# Patient Record
Sex: Female | Born: 1970 | Race: Black or African American | Hispanic: No | Marital: Single | Smoking: Former smoker
Health system: Southern US, Community
[De-identification: ages and names within clinical notes are randomized; demographics above are authoritative.]

## PROBLEM LIST (undated history)

## (undated) DIAGNOSIS — E079 Disorder of thyroid, unspecified: Secondary | ICD-10-CM

## (undated) DIAGNOSIS — D573 Sickle-cell trait: Secondary | ICD-10-CM

## (undated) DIAGNOSIS — N390 Urinary tract infection, site not specified: Secondary | ICD-10-CM

## (undated) DIAGNOSIS — F32A Depression, unspecified: Secondary | ICD-10-CM

## (undated) DIAGNOSIS — F419 Anxiety disorder, unspecified: Secondary | ICD-10-CM

## (undated) DIAGNOSIS — D649 Anemia, unspecified: Secondary | ICD-10-CM

## (undated) DIAGNOSIS — G709 Myoneural disorder, unspecified: Secondary | ICD-10-CM

## (undated) DIAGNOSIS — F329 Major depressive disorder, single episode, unspecified: Secondary | ICD-10-CM

## (undated) HISTORY — DX: Anemia, unspecified: D64.9

## (undated) HISTORY — PX: BREAST SURGERY: SHX581

## (undated) HISTORY — PX: BREAST BIOPSY: SHX20

---
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
## 1997-06-25 ENCOUNTER — Inpatient Hospital Stay (HOSPITAL_COMMUNITY): Admission: AD | Admit: 1997-06-25 | Discharge: 1997-06-25 | Payer: Self-pay | Admitting: *Deleted

## 1997-06-27 ENCOUNTER — Ambulatory Visit (HOSPITAL_COMMUNITY): Admission: RE | Admit: 1997-06-27 | Discharge: 1997-06-27 | Payer: Self-pay | Admitting: *Deleted

## 1997-07-15 ENCOUNTER — Emergency Department (HOSPITAL_COMMUNITY): Admission: EM | Admit: 1997-07-15 | Discharge: 1997-07-15 | Payer: Self-pay | Admitting: Emergency Medicine

## 1998-11-02 ENCOUNTER — Inpatient Hospital Stay (HOSPITAL_COMMUNITY): Admission: AD | Admit: 1998-11-02 | Discharge: 1998-11-05 | Payer: Self-pay | Admitting: Obstetrics and Gynecology

## 1998-12-05 ENCOUNTER — Other Ambulatory Visit: Admission: RE | Admit: 1998-12-05 | Discharge: 1998-12-05 | Payer: Self-pay | Admitting: Obstetrics and Gynecology

## 1999-01-25 ENCOUNTER — Encounter (INDEPENDENT_AMBULATORY_CARE_PROVIDER_SITE_OTHER): Payer: Self-pay

## 1999-01-25 ENCOUNTER — Other Ambulatory Visit: Admission: RE | Admit: 1999-01-25 | Discharge: 1999-01-25 | Payer: Self-pay | Admitting: Obstetrics and Gynecology

## 1999-03-10 ENCOUNTER — Emergency Department (HOSPITAL_COMMUNITY): Admission: EM | Admit: 1999-03-10 | Discharge: 1999-03-10 | Payer: Self-pay | Admitting: Emergency Medicine

## 1999-11-02 ENCOUNTER — Emergency Department (HOSPITAL_COMMUNITY): Admission: EM | Admit: 1999-11-02 | Discharge: 1999-11-02 | Payer: Self-pay | Admitting: Emergency Medicine

## 2001-04-21 ENCOUNTER — Inpatient Hospital Stay (HOSPITAL_COMMUNITY): Admission: AD | Admit: 2001-04-21 | Discharge: 2001-04-21 | Payer: Self-pay | Admitting: Obstetrics and Gynecology

## 2001-04-21 ENCOUNTER — Other Ambulatory Visit: Admission: RE | Admit: 2001-04-21 | Discharge: 2001-04-21 | Payer: Self-pay | Admitting: Obstetrics and Gynecology

## 2001-10-27 ENCOUNTER — Inpatient Hospital Stay (HOSPITAL_COMMUNITY): Admission: AD | Admit: 2001-10-27 | Discharge: 2001-10-30 | Payer: Self-pay | Admitting: Obstetrics and Gynecology

## 2003-09-30 ENCOUNTER — Other Ambulatory Visit: Admission: RE | Admit: 2003-09-30 | Discharge: 2003-09-30 | Payer: Self-pay | Admitting: Obstetrics and Gynecology

## 2004-02-02 ENCOUNTER — Emergency Department (HOSPITAL_COMMUNITY): Admission: EM | Admit: 2004-02-02 | Discharge: 2004-02-02 | Payer: Self-pay | Admitting: Emergency Medicine

## 2004-11-29 ENCOUNTER — Other Ambulatory Visit: Admission: RE | Admit: 2004-11-29 | Discharge: 2004-11-29 | Payer: Self-pay | Admitting: Obstetrics and Gynecology

## 2007-06-27 ENCOUNTER — Inpatient Hospital Stay (HOSPITAL_COMMUNITY): Admission: AD | Admit: 2007-06-27 | Discharge: 2007-06-27 | Payer: Self-pay | Admitting: Obstetrics and Gynecology

## 2008-02-23 ENCOUNTER — Inpatient Hospital Stay (HOSPITAL_COMMUNITY): Admission: RE | Admit: 2008-02-23 | Discharge: 2008-02-26 | Payer: Self-pay | Admitting: Obstetrics and Gynecology

## 2008-04-18 ENCOUNTER — Emergency Department (HOSPITAL_COMMUNITY): Admission: EM | Admit: 2008-04-18 | Discharge: 2008-04-18 | Payer: Self-pay | Admitting: Emergency Medicine

## 2010-05-04 ENCOUNTER — Inpatient Hospital Stay (INDEPENDENT_AMBULATORY_CARE_PROVIDER_SITE_OTHER)
Admission: RE | Admit: 2010-05-04 | Discharge: 2010-05-04 | Disposition: A | Discharge status: Home / Self Care | Payer: BC Managed Care – PPO | Source: Ambulatory Visit | Attending: Family Medicine | Admitting: Family Medicine

## 2010-05-04 DIAGNOSIS — J069 Acute upper respiratory infection, unspecified: Secondary | ICD-10-CM

## 2010-05-04 LAB — POCT URINALYSIS DIPSTICK
Bilirubin Urine: NEGATIVE
Glucose, UA: NEGATIVE mg/dL
Hgb urine dipstick: NEGATIVE
Ketones, ur: NEGATIVE mg/dL
Nitrite: NEGATIVE
Protein, ur: NEGATIVE mg/dL
Specific Gravity, Urine: 1.015 (ref 1.005–1.030)
Urobilinogen, UA: 0.2 mg/dL (ref 0.0–1.0)
pH: 6 (ref 5.0–8.0)

## 2010-05-04 LAB — POCT PREGNANCY, URINE: Preg Test, Ur: NEGATIVE

## 2010-06-12 LAB — COMPREHENSIVE METABOLIC PANEL
ALT: 13 U/L (ref 0–35)
AST: 12 U/L (ref 0–37)
Albumin: 3.4 g/dL — ABNORMAL LOW (ref 3.5–5.2)
Alkaline Phosphatase: 38 U/L — ABNORMAL LOW (ref 39–117)
BUN: 3 mg/dL — ABNORMAL LOW (ref 6–23)
CO2: 28 mEq/L (ref 19–32)
Calcium: 8.8 mg/dL (ref 8.4–10.5)
Chloride: 103 mEq/L (ref 96–112)
Creatinine, Ser: 0.59 mg/dL (ref 0.4–1.2)
GFR calc Af Amer: >60 mL/min (ref >60)
GFR calc non Af Amer: >60 mL/min (ref >60)
Glucose, Bld: 94 mg/dL (ref 70–99)
Potassium: 3.7 mEq/L (ref 3.5–5.1)
Sodium: 135 mEq/L (ref 135–145)
Total Bilirubin: 0.2 mg/dL — ABNORMAL LOW (ref 0.3–1.2)
Total Protein: 6.7 g/dL (ref 6.0–8.3)

## 2010-06-12 LAB — URINALYSIS, ROUTINE W REFLEX MICROSCOPIC
Bilirubin Urine: NEGATIVE
Glucose, UA: NEGATIVE mg/dL
Hgb urine dipstick: NEGATIVE
Ketones, ur: NEGATIVE mg/dL
Nitrite: NEGATIVE
Protein, ur: NEGATIVE mg/dL
Specific Gravity, Urine: 1.016 (ref 1.005–1.030)
Urobilinogen, UA: 0.2 mg/dL (ref 0.0–1.0)
pH: 6 (ref 5.0–8.0)

## 2010-06-12 LAB — PREGNANCY, URINE: Preg Test, Ur: NEGATIVE

## 2010-06-12 LAB — DIFFERENTIAL
Basophils Absolute: 0 10*3/uL (ref 0.0–0.1)
Basophils Relative: 0 % (ref 0–1)
Eosinophils Absolute: 0.1 10*3/uL (ref 0.0–0.7)
Eosinophils Relative: 2 % (ref 0–5)
Lymphocytes Relative: 31 % (ref 12–46)
Lymphs Abs: 2 10*3/uL (ref 0.7–4.0)
Monocytes Absolute: 0.3 10*3/uL (ref 0.1–1.0)
Monocytes Relative: 5 % (ref 3–12)
Neutro Abs: 4.1 10*3/uL (ref 1.7–7.7)
Neutrophils Relative %: 63 % (ref 43–77)

## 2010-06-12 LAB — URINE MICROSCOPIC-ADD ON

## 2010-06-12 LAB — CBC
HCT: 34.3 % — ABNORMAL LOW (ref 36.0–46.0)
Hemoglobin: 11.8 g/dL — ABNORMAL LOW (ref 12.0–15.0)
MCHC: 34.5 g/dL (ref 30.0–36.0)
MCV: 87.7 fL (ref 78.0–100.0)
Platelets: 308 10*3/uL (ref 150–400)
RBC: 3.91 MIL/uL (ref 3.87–5.11)
RDW: 13.3 % (ref 11.5–15.5)
WBC: 6.5 10*3/uL (ref 4.0–10.5)

## 2010-06-12 LAB — LIPASE, BLOOD: Lipase: 23 U/L (ref 11–59)

## 2010-06-12 LAB — POCT CARDIAC MARKERS
CKMB, poc: <1 ng/mL — ABNORMAL LOW (ref 1.0–8.0)
Myoglobin, poc: 32.9 ng/mL (ref 12–200)
Troponin i, poc: <0.05 ng/mL (ref 0.00–0.09)

## 2010-06-12 LAB — D-DIMER, QUANTITATIVE: D-Dimer, Quant: 0.34 ug/mL-FEU (ref 0.00–0.48)

## 2010-07-10 NOTE — Op Note (Signed)
Shelby [name], Shelby [name]              ACCOUNT NO.:  [id number]   MEDICAL RECORD NO.:  [id number]          PATIENT TYPE:  INP   LOCATION:  9148                          FACILITY:  [facility]   PHYSICIAN:  Juluis Mire, M.D.   DATE OF BIRTH:  03/03/1970   DATE OF PROCEDURE:  02/23/2008  DATE OF DISCHARGE:                               OPERATIVE REPORT   PREOPERATIVE DIAGNOSIS:  Intrauterine pregnancy at term with prior  cesarean section for repeat.   POSTOPERATIVE DIAGNOSIS:  Intrauterine pregnancy at term with prior  cesarean section for repeat.   PROCEDURE:  Low transverse cesarean section.   SURGEON:  Juluis Mire, MD   ANESTHESIA:  Spinal.   ESTIMATED BLOOD LOSS:  600 mL.   PACKS AND DRAINS:  None.   INTRAOPERATIVE BLOOD PLACEMENT AND COMPLICATIONS:  None.   INDICATION:  Dictated history and physical.   PROCEDURE IN DETAIL:  The patient was taken to the OR, placed in supine  position with left lateral tilt.  After satisfactory level of spinal  anesthesia was obtained, the abdomen was prepped out with Betadine and  draped sterile field.  The prior low transverse skin incision was  identified and excised.  Incision was extended through the subcutaneous  tissue.  Fascia was entered sharply, incision fashioned laterally.  Fascia was taken off the muscles superiorly and inferiorly.  Rectus  muscles were separated in the midline.  Perineum was entered sharply.  Incision of perineum extended both superiorly and inferiorly.  A low  transverse bladder flap was developed.  Low transverse uterine incision  begun with a knife and extended laterally using manual traction.  Amniotic fluid was clear and presented in a vertex presentation, was  delivered with fundal pressure with the aid of the vacuum extractor.  The infant was a viable female who weighed 8 pounds 3 ounces, Apgars  were 8/9, umbilical artery pH was 7.30.  Placenta was delivered  manually.  Uterus exteriorized for closure.   Uterus was closed with  running locking suture of 0 chromic using a two-layer closure technique.  We had good hemostasis.  Tubes and ovaries were unremarkable.  Uterus  was returned to the abdominal cavity.  We irrigated the pelvis, had good  hemostasis, and clear urine output.  Muscles and peritoneum closed with  a running suture of 3-0 Vicryl.  Fascia closed with a running suture of  0 PDS.  Skin was closed with staples and Steri-Strips.  Sponges, instrument, and  needle count reported as correct by circulating nurse x2.  Foley  catheter remained clear at the time of closure.  The patient tolerated  the procedure well and was returned to the recovery room in stable  condition.      Juluis Mire, M.D.  Electronically Signed     JSM/MEDQ  D:  02/23/2008  T:  02/23/2008  Job:  [id number]

## 2010-07-10 NOTE — Discharge Summary (Signed)
[name], [name]              ACCOUNT NO.:  [id number]   MEDICAL RECORD NO.:  [id number]          PATIENT TYPE:  INP   LOCATION:  9148                          FACILITY:  [facility]   PHYSICIAN:  Freddy Finner, M.D.   DATE OF BIRTH:  January 27, 1971   DATE OF ADMISSION:  02/23/2008  DATE OF DISCHARGE:  02/26/2008                               DISCHARGE SUMMARY   ADMITTING DIAGNOSES:  1. Intrauterine pregnancy at term.  2. Previous cesarean section, desires repeat.   DISCHARGE DIAGNOSES:  1. Status post low-transverse cesarean section.  2. Viable female infant.   PROCEDURE:  Repeat low-transverse cesarean section.   REASON FOR ADMISSION:  Please see written H&P.   HOSPITAL COURSE:  The patient is a 40 year old African American married  female, gravida 7, para 2 that presented to [facility]  for scheduled cesarean section.  The patient had had 2 previous cesarean  deliveries and desired repeat.  On the morning of admission, the patient  was taken to operating room where spinal anesthesia was administered  without difficulty.  A low-transverse incision was made with delivery of  a viable female infant weighing 8 pounds 3 ounces with Apgars of 8 at  one minute and 9 at five minutes.  Arterial cord pH was 7.30.  The  patient tolerated the procedure well and was taken to the recovery room  in stable condition.  On postoperative day #1, the patient was without  complaint.  Vital signs were stable.  She was afebrile.  Abdomen soft.  Fundus firm and nontender.  Abdominal dressing noted be clean, dry, and  intact.  On postoperative day #2, the patient was without complaint.  Vital signs remained stable.  She was afebrile.  Fundus firm and  nontender.  Incision was clean, dry, and intact.  The patient is  ambulating well and tolerating a regular diet without complaints of  nausea and vomiting.  On postoperative day #3, the patient was without  complaint.  Vital signs remained  stable.  She was afebrile.  Fundus firm  and nontender.  Incision was clean, dry, and intact.  Staples removed,  and the patient was later discharged home.   CONDITION ON DISCHARGE:  Stable.   DIET:  Regular as tolerated.   ACTIVITY:  No heavy lifting, no driving x2 weeks, no vaginal entry.   FOLLOW UP:  Patient to follow up in the office in 1 week for an incision  check.  She is to call for temperature greater than 100 degrees,  persistent nausea, vomiting, heavy vaginal bleeding, and/or redness or  drainage from the incisional site.   DISCHARGE MEDICATIONS:  Tylox #30, 1 p.o. every 4-6 hours p.r.n., Motrin  600 mg every 6 hours, prenatal vitamins 1 p.o. daily, Colace 1 p.o.  daily p.r.n.      Julio Sicks, N.P.      Freddy Finner, M.D.  Electronically Signed    CC/MEDQ  D:  02/26/2008  T:  02/26/2008  Job:  [id number]

## 2010-07-10 NOTE — H&P (Signed)
NAMEKENDEL, PESNELL NO.:  [id number]   MEDICAL RECORD NO.:  [id number]          PATIENT TYPE:  INP   LOCATION:  NA                            [facility]ACILITY:  [facility]   PHYSICIAN:  Juluis Mire, M.D.   DATE OF BIRTH:  10/13/1970   DATE OF ADMISSION:  02/23/2008  DATE OF DISCHARGE:                              HISTORY & PHYSICAL   HISTORY OF PRESENT ILLNESS:  The patient is a 40 year old gravida 7,  para 2, abortus 4, female estimated date of confinement of January 2  given estimated gestational age of [redacted] weeks.  The patient has had 2  prior cesarean sections now presents for repeat cesarean section.  Prenatal course has been complicated by advanced maternal age.  She did  undergo first trimester screening that was negative and declined  amniocentesis.  She is also positive for sickle cell trait.  We never  got the father of the baby in for testing, but the patient states she  would not terminate the pregnancy based on any findings.  Therefore, we  did not proceed with amniocentesis.  Her urine was positive for group B  strep.  She will be given antibiotics preoperatively.   IN TERMS OF ALLERGIES:  She is sensitive to glove powder.   MEDICATIONS:  Include prenatal vitamins.   FOR PAST MEDICAL HISTORY, FAMILY HISTORY, AND SOCIAL HISTORY:  Please  see prenatal records.   REVIEW OF SYSTEMS:  Noncontributory.   PHYSICAL EXAMINATION:  VITAL SIGNS:  The patient is afebrile, stable  vital signs.  HEENT:  The patient is normocephalic.  Pupils equal, round, reactive to  light, and accommodation.  Extraocular movements are intact.  Sclerae  and conjunctivae are clear.  Oropharynx clear.  NECK:  Without thyromegaly.  BREASTS:  Not examined.  LUNGS:  Clear.  CARDIOVASCULAR SYSTEM:  Regular rhythm and rate, great 2/6 systolic  ejection murmur.  No clicks or gallops.  ABDOMEN:  Gravid uterus consistent with dates.  PELVIC:  Deferred.  EXTREMITIES:  Trace edema.  NEUROLOGY:   Grossly within normal limits.   IMPRESSION:  1. Intrauterine pregnancy at 39 plus weeks.  2. Two prior cesarean sections for repeat.  3. Sickle cell trait.  4. Positive group B strep in urine.  5. Advance maternal age with negative first trimester screening.   PLAN:  The patient will undergo repeat cesarean section.  The risks of  surgery have been discussed including the risk of infection.  The risk  of hemorrhage that could require transfusion with the risk of AIDS or  hepatitis.  Risk of injury to adjacent organs including bladder, bowel,  ureters that could require further exploratory surgery.  Risk of deep  venous thrombosis and pulmonary embolus.  The patient does understand  the potential risk and complications.       Juluis Mire, M.D.  Electronically Signed     JSM/MEDQ  D:  02/22/2008  T:  02/23/2008  Job:  [id number]

## 2010-07-13 NOTE — H&P (Signed)
NAME:  Shelby [name], Shelby [name]                        ACCOUNT NO.:  [id number]   MEDICAL RECORD NO.:  [id number]                   PATIENT TYPE:  INP   LOCATION:  9198                                 FACILITY:  [facility]   PHYSICIAN:  Juluis Mire, M.D.                DATE OF BIRTH:  March 21, 1970   DATE OF ADMISSION:  10/27/2001  DATE OF DISCHARGE:                                HISTORY & PHYSICAL   HISTORY OF PRESENT ILLNESS:  The patient is a 40 year old gravida 6, para 1-  0-4-1 married female who presents for a repeat low transverse cesarean  section.  Estimated date of confinement by ultrasound is September 11th  giving an estimated gestational age of approximately 39 weeks.   The patient had a prior low transverse cesarean section with the last  pregnancy due to failure to progress.  That infant did weigh 9 pounds 3  ounces.  A trial of labor was discussed with the patient.  She declines this  and wishes to proceed with repeat cesarean section for which she is admitted  at the present time.  Her prenatal course has been otherwise uncomplicated.  She does have a history of sickle trait.  Her husband is Caucasian.  They  decided not to proceed with a rescreen of him despite our discussion.   ALLERGIES:  No known drug allergies.   MEDICATIONS:  Include prenatal vitamins.   PAST MEDICAL HISTORY/FAMILY HISTORY/SOCIAL HISTORY:  Please see prenatal  records.   PRENATAL LABORATORY DATA:  The patient is A positive, negative antibody  screen.  Negative hepatitis B surface antigen.  Glucola 50 gm was within  normal limits.  The patient did decline maternal serum screening.   REVIEW OF SYSTEMS:  Noncontributory.   PHYSICAL EXAMINATION:  VITAL SIGNS:  The patient is afebrile, stable vital  signs.  HEENT:  The patient is normocephalic.  Pupils equal, round, reactive to  light and accommodation.  Extraocular movements are intact.  Sclerae and  conjunctivae are clear.  Oropharynx is clear.  NECK:  Without thyromegaly.  BREASTS:  Are glandular with no discrete masses.  LUNGS:  Clear.  CARDIOVASCULAR:  Regular rate, grade 2/6 systolic ejection murmur without  clicks or gallops.  ABDOMEN:  Gravid uterus consistent with date.  No organomegaly noted.  PELVIC:  Deferred.  EXTREMITIES:  Trace edema.  NEUROLOGICAL:  Grossly within normal limits.   IMPRESSION:  Intrauterine pregnancy at term with prior cesarean section  desires a repeat.   PLAN:  The patient to undergo repeat cesarean section at her request.  The  risks of surgery have been discussed including the risks of infection.  The  risk of hemorrhage that could necessitate transfusion with the risk of AIDS  or  hepatitis.  The risk of injury to adjacent organs including bladder, bowel,  or ureters that could require further exploratory surgery.  The risk of deep  venous thrombosis  and pulmonary embolus.  The patient appears to understand  indications and risks and acceptance of them.                                                Juluis Mire, M.D.    JSM/MEDQ  D:  10/27/2001  T:  10/27/2001  Job:  [id number]

## 2010-07-13 NOTE — Discharge Summary (Signed)
   NAME:  Shelby [name], Shelby [name]                        ACCOUNT NO.:  [id number]   MEDICAL RECORD NO.:  [id number]                   PATIENT TYPE:  INP   LOCATION:  9148                                 FACILITY:  [facility]   PHYSICIAN:  Michelle L. Vincente Poli, M.D.            DATE OF BIRTH:  1970-10-06   DATE OF ADMISSION:  10/27/2001  DATE OF DISCHARGE:  10/30/2001                                 DISCHARGE SUMMARY   ADMITTING DIAGNOSES:  1. Intrauterine pregnancy at term.  2. Previous cesarean delivery, desires repeat.   DISCHARGE DIAGNOSES:  1. Status post repeat low transverse cesarean delivery.  2. Viable female infant.   PROCEDURE:  Repeat low transverse cesarean section.   REASON FOR ADMISSION:  Please see dictated H&P.   HOSPITAL COURSE:  The patient was admitted for scheduled repeat cesarean  delivery.  The patient was taken to the operating room where spinal  anesthesia was administered without difficulty.  A low transverse incision  was made with the delivery of a viable female infant weighing 7 pounds 15  ounces, Apgars of 8 at one minute, 9 at five minutes.  Arterial cord pH was  noted to be 7.32.  The patient tolerated procedure well and was taken to the  recovery room in stable condition.  On postoperative day one patient had  good return of bowel function.  Abdomen was soft.  Abdominal dressing was  partially removed.  Incision was clean, dry, and intact.  Laboratories  revealed a hemoglobin of 9.8, WBC count of 7.8, platelet count of 217,000.  On postoperative day two abdomen was soft.  Incision was clean, dry, and  intact.  The patient was tolerating a regular diet without complaints of  nausea and vomiting.  On postoperative day three patient was ambulating well  without assistance.  Incision was clean, dry, and intact.  Staples were  removed and patient was discharged home.   CONDITION ON DISCHARGE:  Good.   DIET:  Regular, as tolerated.   ACTIVITY:  No heavy lifting.  No  driving x2 weeks.  No vaginal entry.   FOLLOW UP:  The patient is to follow up in the office in one to two weeks  for an incision check.  She should call for temperature greater than 100  degrees, persistent nausea and vomiting, heavy vaginal bleeding, and/or  redness or drainage from the incision site.   DISCHARGE MEDICATIONS:  1. Tylox one q.4-6h. p.r.n. pain.  2. Ibuprofen 600 mg q.6h. p.r.n.  3. Prenatal vitamins one p.o. daily.     Julio Sicks, NP                          Stann Mainland. Vincente Poli, M.D.    CC/MEDQ  D:  12/05/2001  T:  12/07/2001  Job:  [id number]

## 2010-07-13 NOTE — Op Note (Signed)
NAME:  Shelby [name], Shelby [name]                        ACCOUNT NO.:  [id number]   MEDICAL RECORD NO.:  [id number]                   PATIENT TYPE:  INP   LOCATION:  NA                                   [facility]   PHYSICIAN:  Juluis Mire, M.D.                DATE OF BIRTH:  07/08/1970   DATE OF PROCEDURE:  10/27/2001  DATE OF DISCHARGE:                                 OPERATIVE REPORT   PREOPERATIVE DIAGNOSES:  1. Intrauterine pregnancy at term.  2. Prior cesarean section, desires repeat.   POSTOPERATIVE DIAGNOSES:  1. Intrauterine pregnancy at term.  2. Prior cesarean section, desires repeat.   OPERATION PERFORMED:  Low transverse cesarean section.   SURGEON:  Juluis Mire, M.D.   ANESTHESIA:  Spinal.   ESTIMATED BLOOD LOSS:  800 cc.   PACKS AND DRAINS:  None.   INTRAOPERATIVE BLOOD REPLACEMENT:  None.   COMPLICATIONS:  None.   INDICATIONS FOR PROCEDURE:  Noted in history and physical.   PROCEDURE:  The patient was taken to the OR, placed in the supine position  left lateral tilt.  After satisfactory level of spinal anesthesia was  obtained, the abdomen was prepped out with Betadine and draped as a sterile  field.  Prior low transverse incision was excised.  The incision was then  extended through the subcutaneous tissue.  The fascia was entered sharply  and the incision extended laterally.  The fascia was taken off the rectus  muscle superiorly and inferiorly using sharp and blunt dissection.  The  rectus muscles were separated in the midline.  The peritoneum was entered  sharply and the incision of the peritoneum extended both superiorly and  inferiorly.  A low transverse bladder flap was developed.  Low transverse  uterine incision was begun with a knife and extended laterally using manual  traction.  The amniotic fluid was clear.  The infant presented in the vertex  presentation with delivery of the __________ head and fundal pressure.  The  infant was viable  female who weighed 7 pounds 15 ounces.  His Apgars were 8/9.  The umbilical cord pH was 7.32.  The placenta was then delivered manually.  Uterus wiped free of remaining membranes and placenta.  The uterus was then  closed with running-locked suture of 0 chromic using two layer closure  technique.  We had good hemostasis.  The uterus was exteriorized.  The tubes  and ovaries unremarkable.  Uterus was returned to the abdominal cavity.  The  pelvic cavity was thoroughly irrigated.  Hemostasis was excellent.  Urine  output clear and adequate.  The rectus muscles were reapproximated with  suture of  3-0 Vicryl.  The fascia was closed with running suture of 0 PDS.  The skin  was closed with staples and Steri-Strips.  Sponge, instrument, and needle  counts reported correct by circulating nurse x2.  Foley catheter remained  clear  until time of closure.  The patient tolerated the procedure well, was  returned to the recovery room in good condition.                                                 Juluis Mire, M.D.    JSM/MEDQ  D:  10/27/2001  T:  10/27/2001  Job:  [id number]

## 2010-11-21 LAB — POCT PREGNANCY, URINE
Operator id: 27537
Preg Test, Ur: POSITIVE

## 2010-11-21 LAB — ABO/RH: ABO/RH(D): A POS

## 2010-11-21 LAB — HCG, QUANTITATIVE, PREGNANCY: hCG, Beta Chain, Quant, S: 17769 — ABNORMAL HIGH

## 2010-11-29 LAB — CBC
HCT: 26.9 % — ABNORMAL LOW (ref 36.0–46.0)
HCT: 31.4 % — ABNORMAL LOW (ref 36.0–46.0)
Hemoglobin: 10.6 g/dL — ABNORMAL LOW (ref 12.0–15.0)
Hemoglobin: 9.2 g/dL — ABNORMAL LOW (ref 12.0–15.0)
MCHC: 33.8 g/dL (ref 30.0–36.0)
MCHC: 34.2 g/dL (ref 30.0–36.0)
MCV: 88.7 fL (ref 78.0–100.0)
MCV: 89.7 fL (ref 78.0–100.0)
Platelets: 194 10*3/uL (ref 150–400)
Platelets: 224 10*3/uL (ref 150–400)
RBC: 3.03 MIL/uL — ABNORMAL LOW (ref 3.87–5.11)
RBC: 3.49 MIL/uL — ABNORMAL LOW (ref 3.87–5.11)
RDW: 13.3 % (ref 11.5–15.5)
RDW: 14 % (ref 11.5–15.5)
WBC: 7 10*3/uL (ref 4.0–10.5)
WBC: 8.1 10*3/uL (ref 4.0–10.5)

## 2010-11-29 LAB — RPR: RPR Ser Ql: NONREACTIVE

## 2011-02-09 ENCOUNTER — Emergency Department (HOSPITAL_COMMUNITY)
Admission: EM | Admit: 2011-02-09 | Discharge: 2011-02-09 | Disposition: A | Discharge status: Home / Self Care | Payer: Self-pay | Source: Home / Self Care

## 2011-02-09 ENCOUNTER — Encounter: Payer: Self-pay | Admitting: Emergency Medicine

## 2011-02-09 DIAGNOSIS — N390 Urinary tract infection, site not specified: Secondary | ICD-10-CM

## 2011-02-09 HISTORY — DX: Urinary tract infection, site not specified: N39.0

## 2011-02-09 HISTORY — DX: Sickle-cell trait: D57.3

## 2011-02-09 LAB — POCT URINALYSIS DIP (DEVICE)
Bilirubin Urine: NEGATIVE
Glucose, UA: NEGATIVE mg/dL
Ketones, ur: NEGATIVE mg/dL
Nitrite: POSITIVE — AB
Protein, ur: NEGATIVE mg/dL
Specific Gravity, Urine: 1.015 (ref 1.005–1.030)
Urobilinogen, UA: 0.2 mg/dL (ref 0.0–1.0)
pH: 6.5 (ref 5.0–8.0)

## 2011-02-09 MED ORDER — CIPROFLOXACIN HCL 250 MG PO TABS: 250.0000 mg | ORAL_TABLET | Freq: Two times a day (BID) | ORAL | Status: AC

## 2011-02-09 NOTE — ED Provider Notes (Signed)
History     CSN: [id number] Arrival date & time: 02/09/2011  5:17 PM   None     Chief Complaint  Patient presents with  . Cystitis    (Consider location/radiation/quality/duration/timing/severity/associated sxs/prior treatment) HPI Comments: Onset one week ago of tingling at end of urine stream, pressure "in bladder" and pain Rt flank area. No fever or chills.   Patient is a 40 y.o. female presenting with dysuria. The history is provided by the patient.  Dysuria  This is a new problem. The current episode started more than 2 days ago. The problem occurs every urination. The problem has been gradually worsening. There has been no fever. Associated symptoms include frequency, urgency and flank pain. Pertinent negatives include no chills, no nausea, no vomiting and no hematuria. She has tried nothing for the symptoms. Her past medical history is significant for recurrent UTIs (last one > 1 yr ago).    Past Medical History  Diagnosis Date  . UTI (lower urinary tract infection)   . Sickle cell trait     Past Surgical History  Procedure Date  . Cesarean section     No family history on file.  History  Substance Use Topics  . Smoking status: Current Everyday Smoker  . Smokeless tobacco: Not on file  . Alcohol Use: Yes    OB History    Grav Para Term Preterm Abortions TAB SAB Ect Mult Living                  Review of Systems  Constitutional: Negative for fever and chills.  Respiratory: Negative for shortness of breath.   Cardiovascular: Negative for chest pain.  Gastrointestinal: Negative for nausea and vomiting.  Genitourinary: Positive for dysuria, urgency, frequency and flank pain. Negative for hematuria.    Allergies  Review of patient's allergies indicates no known allergies.  Home Medications   Current Outpatient Rx  Name Route Sig Dispense Refill  . CIPROFLOXACIN HCL 250 MG PO TABS Oral Take 1 tablet (250 mg total) by mouth every 12 (twelve) hours. 10  tablet 0    BP 140/85  Pulse 76  Temp(Src) 99 F (37.2 C) (Oral)  Resp 16  SpO2 100%  LMP 01/17/2011  Physical Exam  Nursing note and vitals reviewed. Constitutional: She appears well-developed and well-nourished. No distress.  Cardiovascular: Normal rate, regular rhythm and normal heart sounds.   Pulmonary/Chest: Effort normal and breath sounds normal. No respiratory distress.  Abdominal: Soft. Bowel sounds are normal. She exhibits no distension and no mass. There is no tenderness. There is no guarding.  Skin: Skin is warm and dry.  Psychiatric: She has a normal mood and affect.    ED Course  Procedures (including critical care time)  Labs Reviewed  POCT URINALYSIS DIP (DEVICE) - Abnormal; Notable for the following:    Hgb urine dipstick SMALL (*)    Nitrite POSITIVE (*)    Leukocytes, UA MODERATE (*) Biochemical Testing Only. Please order routine urinalysis from main lab if confirmatory testing is needed.   All other components within normal limits  POCT URINALYSIS DIPSTICK   No results found.   1. UTI (lower urinary tract infection)       MDM  UA pos        Melody Comas, [place] 02/09/11 1835

## 2011-02-09 NOTE — ED Notes (Signed)
About a week ago, patient started noticing urine odor and urine became darker, cloudy, and pain in low abdomen, pain in right lower back.

## 2011-11-21 ENCOUNTER — Other Ambulatory Visit: Payer: Self-pay | Admitting: Obstetrics and Gynecology

## 2011-11-21 DIAGNOSIS — N6452 Nipple discharge: Secondary | ICD-10-CM

## 2011-11-25 ENCOUNTER — Other Ambulatory Visit: Payer: Self-pay

## 2011-11-28 ENCOUNTER — Other Ambulatory Visit: Payer: Self-pay

## 2011-12-05 ENCOUNTER — Ambulatory Visit
Admission: RE | Admit: 2011-12-05 | Discharge: 2011-12-05 | Disposition: A | Discharge status: Home / Self Care | Payer: BC Managed Care – PPO | Source: Ambulatory Visit | Attending: Obstetrics and Gynecology | Admitting: Obstetrics and Gynecology

## 2011-12-05 ENCOUNTER — Other Ambulatory Visit: Payer: Self-pay | Admitting: Obstetrics and Gynecology

## 2011-12-05 DIAGNOSIS — N6452 Nipple discharge: Secondary | ICD-10-CM

## 2011-12-09 ENCOUNTER — Other Ambulatory Visit: Payer: Self-pay | Admitting: Obstetrics and Gynecology

## 2011-12-09 DIAGNOSIS — N6452 Nipple discharge: Secondary | ICD-10-CM

## 2011-12-12 ENCOUNTER — Other Ambulatory Visit: Payer: Self-pay

## 2011-12-19 ENCOUNTER — Other Ambulatory Visit: Payer: BC Managed Care – PPO

## 2012-12-18 ENCOUNTER — Other Ambulatory Visit: Payer: Self-pay | Admitting: Obstetrics and Gynecology

## 2012-12-18 DIAGNOSIS — N6452 Nipple discharge: Secondary | ICD-10-CM

## 2013-05-04 ENCOUNTER — Other Ambulatory Visit: Payer: Self-pay | Admitting: Gastroenterology

## 2013-05-04 DIAGNOSIS — R1013 Epigastric pain: Secondary | ICD-10-CM

## 2013-06-02 ENCOUNTER — Other Ambulatory Visit: Payer: Self-pay | Admitting: Obstetrics and Gynecology

## 2013-06-02 DIAGNOSIS — N6452 Nipple discharge: Secondary | ICD-10-CM

## 2013-06-10 ENCOUNTER — Ambulatory Visit
Admission: RE | Admit: 2013-06-10 | Discharge: 2013-06-10 | Disposition: A | Discharge status: Home / Self Care | Payer: BC Managed Care – PPO | Source: Ambulatory Visit | Attending: Obstetrics and Gynecology | Admitting: Obstetrics and Gynecology

## 2013-06-10 ENCOUNTER — Other Ambulatory Visit: Payer: Self-pay | Admitting: Obstetrics and Gynecology

## 2013-06-10 DIAGNOSIS — N6452 Nipple discharge: Secondary | ICD-10-CM

## 2013-06-10 DIAGNOSIS — R921 Mammographic calcification found on diagnostic imaging of breast: Secondary | ICD-10-CM

## 2013-06-17 ENCOUNTER — Ambulatory Visit
Admission: RE | Admit: 2013-06-17 | Discharge: 2013-06-17 | Disposition: A | Discharge status: Home / Self Care | Payer: BC Managed Care – PPO | Source: Ambulatory Visit | Attending: Obstetrics and Gynecology | Admitting: Obstetrics and Gynecology

## 2013-06-17 ENCOUNTER — Encounter (INDEPENDENT_AMBULATORY_CARE_PROVIDER_SITE_OTHER): Payer: Self-pay

## 2013-06-17 DIAGNOSIS — R921 Mammographic calcification found on diagnostic imaging of breast: Secondary | ICD-10-CM

## 2013-06-17 HISTORY — PX: BREAST BIOPSY: SHX20

## 2013-06-18 ENCOUNTER — Other Ambulatory Visit: Payer: Self-pay | Admitting: Gastroenterology

## 2013-06-18 ENCOUNTER — Ambulatory Visit
Admission: RE | Admit: 2013-06-18 | Discharge: 2013-06-18 | Disposition: A | Discharge status: Home / Self Care | Payer: BC Managed Care – PPO | Source: Ambulatory Visit | Attending: Gastroenterology | Admitting: Gastroenterology

## 2013-06-18 DIAGNOSIS — R1013 Epigastric pain: Secondary | ICD-10-CM

## 2013-06-25 ENCOUNTER — Other Ambulatory Visit (INDEPENDENT_AMBULATORY_CARE_PROVIDER_SITE_OTHER): Payer: Self-pay | Admitting: General Surgery

## 2013-06-25 ENCOUNTER — Ambulatory Visit (INDEPENDENT_AMBULATORY_CARE_PROVIDER_SITE_OTHER): Payer: BC Managed Care – PPO | Admitting: General Surgery

## 2013-06-25 ENCOUNTER — Encounter (INDEPENDENT_AMBULATORY_CARE_PROVIDER_SITE_OTHER): Payer: Self-pay | Admitting: General Surgery

## 2013-06-25 VITALS — BP 118/78 | HR 80 | Temp 97.6°F | Resp 14 | Ht 63.5 in | Wt 155.4 lb

## 2013-06-25 DIAGNOSIS — R921 Mammographic calcification found on diagnostic imaging of breast: Secondary | ICD-10-CM

## 2013-06-25 DIAGNOSIS — R928 Other abnormal and inconclusive findings on diagnostic imaging of breast: Secondary | ICD-10-CM

## 2013-06-25 DIAGNOSIS — N6459 Other signs and symptoms in breast: Secondary | ICD-10-CM

## 2013-06-25 DIAGNOSIS — N6452 Nipple discharge: Secondary | ICD-10-CM

## 2013-06-29 ENCOUNTER — Telehealth (INDEPENDENT_AMBULATORY_CARE_PROVIDER_SITE_OTHER): Payer: Self-pay

## 2013-06-29 NOTE — Telephone Encounter (Signed)
LMOM advising pt that she is going to need to come back to the office to discuss her gallbladder issues with Dr Donne Hazel. I made her another appt to see Dr Donne Hazel on 07/02/13 arrive 8:15/8:30 in order to discuss wether or not she needs to have the gallbladder surgery along with the breast surgery.

## 2013-06-29 NOTE — Progress Notes (Signed)
Patient ID: Shelby [name], female   DOB: [birth year], 43 y.o.   MRN: [id number]  Chief Complaint  Patient presents with  . New Evaluation    discharge lft nipple    HPI Shelby [name] is a 43 y.o. female.  HPI 39 yof referred by Dr Arnetha Gula who has been having left nipple discharge for some time. This mostly is brown. This is spontaneous. She does have a family history in a paternal grandmother as well as an aunt with breast cancer. She has undergone an evaluation with mammography that showed left breast microcalcifications. Her breast density category is C. The mammogram showed a loose group of calcifications measuring 2.4 cm in the lateral portion of the left breast. She also had an ultrasound showing numerous dilated ducts in the 3:00 location of the left breast. The right breast was negative. This was followed up with a ductogram that showed a dilated duct in the 3:00 location of the left breast corresponding to the ultrasound findings. There no definite filling defects. The duct did terminate in the region of the previously mentioned calcifications that the radiologist read as suspicious for ductal carcinoma in situ. She underwent a biopsy of the calcifications which shows fibrocystic changes with calcifications. This is concordant. She was then recommended surgical consultation for the duct discharging fluid associated with these calcifications. She has discharge it is still present but otherwise has no real complaints today. Past Medical History  Diagnosis Date  . UTI (lower urinary tract infection)   . Sickle cell trait   . Anemia   . Sickle cell trait 1992    Past Surgical History  Procedure Laterality Date  . Cesarean section    . Dilation and curettage of uterus  1993    Miscarriage    History reviewed. No pertinent family history.  Social History History  Substance Use Topics  . Smoking status: Former Smoker    Quit date: 06/26/2011  . Smokeless tobacco: Not on file   . Alcohol Use: Yes     Comment: rarely    No Known Allergies  Current Outpatient Prescriptions  Medication Sig Dispense Refill  . citalopram (CELEXA) 10 MG tablet       . pantoprazole (PROTONIX) 40 MG tablet        No current facility-administered medications for this visit.    Review of Systems Review of Systems  Constitutional: Negative for fever, chills and unexpected weight change.  HENT: Positive for trouble swallowing. Negative for congestion, hearing loss, sore throat and voice change.   Eyes: Negative for visual disturbance.  Respiratory: Negative for cough and wheezing.   Cardiovascular: Negative for chest pain, palpitations and leg swelling.  Gastrointestinal: Negative for nausea, vomiting, abdominal pain, diarrhea, constipation, blood in stool, abdominal distention and anal bleeding.  Genitourinary: Negative for hematuria, vaginal bleeding and difficulty urinating.  Musculoskeletal: Negative for arthralgias.  Skin: Negative for rash and wound.  Neurological: Negative for seizures, syncope and headaches.  Hematological: Negative for adenopathy. Does not bruise/bleed easily.  Psychiatric/Behavioral: Negative for confusion.    Blood pressure 118/78, pulse 80, temperature 97.6 F (36.4 C), resp. rate 14, height 5' 3.5" (1.613 m), weight 155 lb 6.4 oz (70.489 kg), last menstrual period 05/17/2013.  Physical Exam Physical Exam  Constitutional: She appears well-developed and well-nourished.  Eyes: No scleral icterus.  Neck: Neck supple.  Cardiovascular: Normal rate, regular rhythm and normal heart sounds.   Pulmonary/Chest: Right breast exhibits no inverted nipple, no mass, no nipple discharge,  no skin change and no tenderness. Left breast exhibits nipple discharge. Left breast exhibits no inverted nipple, no mass (several different sites of clear/brown expressed discharge), no skin change and no tenderness.  Lymphadenopathy:    She has no cervical adenopathy.    She  has no axillary adenopathy.       Right: No supraclavicular adenopathy present.       Left: No supraclavicular adenopathy present.    Data Reviewed  ACR Breast Density Category c: The breast tissue is heterogeneously  dense, which may obscure small masses.  FINDINGS:  Right breast is negative. Within the lateral portion of the left  breast, there is a loose group of calcifications measuring 2.4 cm.  Calcifications are faint and primarily punctate. No suspicious mass  or distortion identified. At the time a mammogram, patient had  copious brown discharge expressed.  Mammographic images were processed with CAD.  On physical exam, I palpate no abnormality in the lateral aspect of  the left breast. I am able to express clear colorless discharge from  multiple ducts. However, in the 2 o'clock location, there is clear  brown discharge easily expressed.  Ultrasound is performed, showing numerous dilated ducts in the 3  o'clock location of the left breast. No intraductal filling defects  are identified.  IMPRESSION:  1. Dilated ducts and calcifications in the 3 o'clock location of the  left breast, suspicious in the setting of spontaneous discharge.  2. Stereotactic guided core biopsy is recommended for the  calcifications.  3. Ductogram is indicated.  RECOMMENDATION:  1. Stereotactic guided core biopsy is recommended and has been  scheduled for the patient.  2. Ductogram is recommended, and is performed on the same day,  dictated separately.  ADDENDUM:  The final pathological diagnosis is fibrocystic changes with  calcifications. This is concordant with the imaging findings. Dr.  Owens Shark recommended surgical consultation for the single duct  discharging brown fluid on recent physical examination and  ductography.  The final pathological diagnosis and recommendation were discussed  with the patient by telephone on 06/21/2013. Her questions were  answered. She reported a small amount  of bruising at the biopsy site  with no pain or palpable hematoma. She was given an appointment with  Dr. Donne Hazel on 06/25/2013 for discussion of the discharging duct.  Electronically Signed  By: Enrique Sack M.D.  On: 06/21/2013 10:06       Study Result    CLINICAL DATA: Left breast calcifications.  EXAM:  LEFT BREAST STEREOTACTIC CORE NEEDLE BIOPSY  COMPARISON: Previous exams.  FINDINGS:  The patient and I discussed the procedure of stereotactic-guided  biopsy including benefits and alternatives. We discussed the high  likelihood of a successful procedure. We discussed the risks of the  procedure including infection, bleeding, tissue injury, clip  migration, and inadequate sampling. Informed written consent was  given. The usual time out protocol was performed immediately prior  to the procedure.  Using sterile technique and 2% Lidocaine as local anesthetic, under  stereotactic guidance, a 9 gauge vacuum assisted core needle biopsy  device was used to perform core needle biopsy of calcifications  within the upper-outer left breast middle depth using a lateral  approach. Specimen radiograph was performed showing calcifications.  Specimens with calcifications are identified for pathology.  At the conclusion of the procedure, a X shaped tissue marker clip  was deployed into the biopsy cavity. Follow-up 2-view mammogram  confirmed clip in appropriate position.  IMPRESSION:  Stereotactic-guided biopsy of left  breast calcifications. No  apparent complications.     Assessment    Left nipple discharge    Plan    Left breast duct excision, seed guided excision of calcs  I do think this ductal system should be excised. She has continued spontaneous unilateral discharge with abnormal imaging although nonspecific.  This is likely benign but cannot be for sure. I will discuss with radiology the excision of these calcifications but I think it would be reasonable to excise both of  these areas as they are related by imaging. I discussed doing this with seed guidance. We discussed the risks including bleeding, infection, further surgery for carcinoma is identified.       Rolm Bookbinder 06/29/2013, 9:21 AM

## 2013-06-29 NOTE — Telephone Encounter (Signed)
Pt returned call. I asked the pt if she was having any gallbladder symptoms b/c we received a call from [facility] this am they needed to refer pt ASAP for gallbladder. The pt did say that she has had abdominal pain for over a year now and the pt has been trying to figure this pain out for sometime now. The pt made a new pt appt with a pcp Dr Delfina Redwood who referred her to Dr Penelope Coop. The pt has gotten a abdominal u/s and endoscopy just last week. The u/s shows gallstones but the endoscopy is normal. The pt reports that the abdominal pain starts in the middle of the abdomen and can shoot strait to the middle of back between shoulder blades. The pt has no n/v. The pt feels that the attacks are triggered by onions at [employer]. I advised pt that I was going to check with Dr Donne Hazel b/c she is scheduled for breast surgery on 07/13/13 and get back with her on what we needed to do.

## 2013-06-29 NOTE — Telephone Encounter (Signed)
LMOM for pt to call me back. I need to ask some questions about her gallbladder before she has surgery with Dr Donne Hazel on 07/13/13 for breast bx.

## 2013-07-02 ENCOUNTER — Ambulatory Visit (INDEPENDENT_AMBULATORY_CARE_PROVIDER_SITE_OTHER): Payer: BC Managed Care – PPO | Admitting: General Surgery

## 2013-07-02 ENCOUNTER — Encounter (INDEPENDENT_AMBULATORY_CARE_PROVIDER_SITE_OTHER): Payer: Self-pay | Admitting: General Surgery

## 2013-07-02 VITALS — BP 110/70 | HR 68 | Resp 18 | Ht 63.5 in | Wt 157.0 lb

## 2013-07-02 DIAGNOSIS — K8020 Calculus of gallbladder without cholecystitis without obstruction: Secondary | ICD-10-CM

## 2013-07-02 NOTE — Progress Notes (Signed)
Subjective:     Patient ID: Shelby [name], female   DOB: October 14, 1970, 43 y.o.   MRN: [id number]  HPI This is a 43 year old female last all last week for a breast complaint. She did not tell me that data she also was being evaluated for gallbladder. She has been having pain in her epigastrium through to her back that lasts a number of hours. It is not always related eating. She also has heartburn that is better with protonix. She still has this pain in her epigastrium to go straight to her back at night primarily. She underwent an evaluation with gastroenterology. She underwent an ultrasound that showed multiple echogenic shadowing stones there was otherwise normal. She comes in today to discuss her gallbladder.  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 I told her this would likely relieve some of her symptoms to some degree.  We will plan on doing in combination with her breast surgery I discussed the procedure in detail.  We discussed the risks and benefits of a laparoscopic cholecystectomy and possible cholangiogram including, but not limited to bleeding, infection, injury to surrounding structures such as the intestine or liver, bile leak, retained gallstones, need to convert to an open procedure, prolonged diarrhea, blood clots such as  DVT, common bile duct injury, anesthesia risks, and possible need for additional procedures.  The likelihood of improvement in symptoms and return to the patient's normal status is good. We discussed the typical post-operative recovery course.

## 2013-07-07 ENCOUNTER — Encounter (HOSPITAL_BASED_OUTPATIENT_CLINIC_OR_DEPARTMENT_OTHER): Payer: Self-pay | Admitting: *Deleted

## 2013-07-07 NOTE — Progress Notes (Signed)
Pt will come in for labs

## 2013-07-09 ENCOUNTER — Encounter (HOSPITAL_BASED_OUTPATIENT_CLINIC_OR_DEPARTMENT_OTHER)
Admission: RE | Admit: 2013-07-09 | Discharge: 2013-07-09 | Disposition: A | Discharge status: Home / Self Care | Payer: BC Managed Care – PPO | Source: Ambulatory Visit | Attending: General Surgery | Admitting: General Surgery

## 2013-07-09 ENCOUNTER — Inpatient Hospital Stay: Admission: RE | Admit: 2013-07-09 | Payer: BC Managed Care – PPO | Source: Ambulatory Visit

## 2013-07-09 LAB — BASIC METABOLIC PANEL
BUN: 7 mg/dL (ref 6–23)
CALCIUM: 9.9 mg/dL (ref 8.4–10.5)
CHLORIDE: 105 meq/L (ref 96–112)
CO2: 27 meq/L (ref 19–32)
CREATININE: 0.4 mg/dL — AB (ref 0.50–1.10)
GFR calc Af Amer: >90 mL/min (ref >90)
GFR calc non Af Amer: >90 mL/min (ref >90)
Glucose, Bld: 90 mg/dL (ref 70–99)
Potassium: 4.2 mEq/L (ref 3.7–5.3)
Sodium: 141 mEq/L (ref 137–147)

## 2013-07-09 LAB — CBC WITH DIFFERENTIAL/PLATELET
BASOS ABS: 0 10*3/uL (ref 0.0–0.1)
BASOS PCT: 0 % (ref 0–1)
EOS PCT: 2 % (ref 0–5)
Eosinophils Absolute: 0.1 10*3/uL (ref 0.0–0.7)
HEMATOCRIT: 35.4 % — AB (ref 36.0–46.0)
Hemoglobin: 11.9 g/dL — ABNORMAL LOW (ref 12.0–15.0)
Lymphocytes Relative: 27 % (ref 12–46)
Lymphs Abs: 1.1 10*3/uL (ref 0.7–4.0)
MCH: 26.7 pg (ref 26.0–34.0)
MCHC: 33.6 g/dL (ref 30.0–36.0)
MCV: 79.4 fL (ref 78.0–100.0)
MONO ABS: 0.3 10*3/uL (ref 0.1–1.0)
Monocytes Relative: 8 % (ref 3–12)
Neutro Abs: 2.6 10*3/uL (ref 1.7–7.7)
Neutrophils Relative %: 63 % (ref 43–77)
PLATELETS: 267 10*3/uL (ref 150–400)
RBC: 4.46 MIL/uL (ref 3.87–5.11)
RDW: 12.4 % (ref 11.5–15.5)
WBC: 4 10*3/uL (ref 4.0–10.5)

## 2013-07-13 ENCOUNTER — Encounter (HOSPITAL_BASED_OUTPATIENT_CLINIC_OR_DEPARTMENT_OTHER): Payer: Self-pay | Admitting: *Deleted

## 2013-07-13 ENCOUNTER — Ambulatory Visit
Admission: RE | Admit: 2013-07-13 | Discharge: 2013-07-13 | Disposition: A | Discharge status: Home / Self Care | Payer: BC Managed Care – PPO | Source: Ambulatory Visit | Attending: General Surgery | Admitting: General Surgery

## 2013-07-13 ENCOUNTER — Encounter (HOSPITAL_BASED_OUTPATIENT_CLINIC_OR_DEPARTMENT_OTHER): Payer: BC Managed Care – PPO | Admitting: Anesthesiology

## 2013-07-13 ENCOUNTER — Ambulatory Visit (HOSPITAL_BASED_OUTPATIENT_CLINIC_OR_DEPARTMENT_OTHER): Payer: BC Managed Care – PPO | Admitting: Anesthesiology

## 2013-07-13 ENCOUNTER — Ambulatory Visit (HOSPITAL_BASED_OUTPATIENT_CLINIC_OR_DEPARTMENT_OTHER)
Admission: RE | Admit: 2013-07-13 | Discharge: 2013-07-14 | Disposition: A | Discharge status: Home / Self Care | Payer: BC Managed Care – PPO | Source: Ambulatory Visit | Attending: General Surgery | Admitting: General Surgery

## 2013-07-13 ENCOUNTER — Encounter (HOSPITAL_BASED_OUTPATIENT_CLINIC_OR_DEPARTMENT_OTHER)
Admission: RE | Disposition: A | Discharge status: Home / Self Care | Payer: Self-pay | Source: Ambulatory Visit | Attending: General Surgery

## 2013-07-13 DIAGNOSIS — Z9049 Acquired absence of other specified parts of digestive tract: Secondary | ICD-10-CM

## 2013-07-13 DIAGNOSIS — R921 Mammographic calcification found on diagnostic imaging of breast: Secondary | ICD-10-CM

## 2013-07-13 DIAGNOSIS — D249 Benign neoplasm of unspecified breast: Secondary | ICD-10-CM

## 2013-07-13 DIAGNOSIS — K219 Gastro-esophageal reflux disease without esophagitis: Secondary | ICD-10-CM | POA: Insufficient documentation

## 2013-07-13 DIAGNOSIS — R920 Mammographic microcalcification found on diagnostic imaging of breast: Secondary | ICD-10-CM

## 2013-07-13 DIAGNOSIS — N6459 Other signs and symptoms in breast: Secondary | ICD-10-CM | POA: Insufficient documentation

## 2013-07-13 DIAGNOSIS — K8010 Calculus of gallbladder with chronic cholecystitis without obstruction: Secondary | ICD-10-CM

## 2013-07-13 DIAGNOSIS — K8020 Calculus of gallbladder without cholecystitis without obstruction: Secondary | ICD-10-CM | POA: Insufficient documentation

## 2013-07-13 DIAGNOSIS — Z87891 Personal history of nicotine dependence: Secondary | ICD-10-CM | POA: Insufficient documentation

## 2013-07-13 HISTORY — PX: CHOLECYSTECTOMY: SHX55

## 2013-07-13 HISTORY — DX: Depression, unspecified: F32.A

## 2013-07-13 HISTORY — DX: Major depressive disorder, single episode, unspecified: F32.9

## 2013-07-13 HISTORY — PX: BREAST EXCISIONAL BIOPSY: SUR124

## 2013-07-13 SURGERY — RADIOACTIVE SEED GUIDED BREAST BIOPSY
Anesthesia: General | Site: Breast

## 2013-07-13 MED ORDER — FENTANYL CITRATE 0.05 MG/ML IJ SOLN
INTRAMUSCULAR | Status: AC
  Filled 2013-07-13: qty 6

## 2013-07-13 MED ORDER — MIDAZOLAM HCL 5 MG/5ML IJ SOLN
INTRAMUSCULAR | Status: DC | PRN
  Administered 2013-07-13: 2 mg via INTRAVENOUS

## 2013-07-13 MED ORDER — MIDAZOLAM HCL 2 MG/2ML IJ SOLN: 0.5000 mg | Freq: Once | INTRAMUSCULAR | Status: DC | PRN

## 2013-07-13 MED ORDER — NEOSTIGMINE METHYLSULFATE 10 MG/10ML IV SOLN
INTRAVENOUS | Status: DC | PRN
  Administered 2013-07-13: 3 mg via INTRAVENOUS

## 2013-07-13 MED ORDER — PROMETHAZINE HCL 25 MG/ML IJ SOLN: 6.2500 mg | INTRAMUSCULAR | Status: DC | PRN

## 2013-07-13 MED ORDER — SODIUM CHLORIDE 0.9 % IV SOLN
INTRAVENOUS | Status: DC
Start: 2013-07-13 — End: 2013-07-14
  Administered 2013-07-13: 20:00:00 via INTRAVENOUS

## 2013-07-13 MED ORDER — ACETAMINOPHEN 325 MG PO TABS
650.0000 mg | ORAL_TABLET | Freq: Four times a day (QID) | ORAL | Status: DC | PRN
  Administered 2013-07-14: 650 mg via ORAL
  Filled 2013-07-13: qty 2

## 2013-07-13 MED ORDER — LIDOCAINE HCL (CARDIAC) 20 MG/ML IV SOLN
INTRAVENOUS | Status: DC | PRN
  Administered 2013-07-13: 50 mg via INTRAVENOUS

## 2013-07-13 MED ORDER — KETOROLAC TROMETHAMINE 30 MG/ML IJ SOLN
INTRAMUSCULAR | Status: DC | PRN
  Administered 2013-07-13: 30 mg via INTRAVENOUS

## 2013-07-13 MED ORDER — HYDROMORPHONE HCL PF 1 MG/ML IJ SOLN
INTRAMUSCULAR | Status: AC
  Filled 2013-07-13: qty 1

## 2013-07-13 MED ORDER — ONDANSETRON HCL 4 MG/2ML IJ SOLN: 4.0000 mg | Freq: Four times a day (QID) | INTRAMUSCULAR | Status: DC | PRN

## 2013-07-13 MED ORDER — HYDROMORPHONE HCL PF 1 MG/ML IJ SOLN
0.2500 mg | INTRAMUSCULAR | Status: DC | PRN
  Administered 2013-07-13 (×2): 0.25 mg via INTRAVENOUS

## 2013-07-13 MED ORDER — FENTANYL CITRATE 0.05 MG/ML IJ SOLN: 50.0000 ug | INTRAMUSCULAR | Status: DC | PRN

## 2013-07-13 MED ORDER — ROCURONIUM BROMIDE 100 MG/10ML IV SOLN
INTRAVENOUS | Status: DC | PRN
  Administered 2013-07-13: 35 mg via INTRAVENOUS

## 2013-07-13 MED ORDER — DEXAMETHASONE SODIUM PHOSPHATE 4 MG/ML IJ SOLN
INTRAMUSCULAR | Status: DC | PRN
  Administered 2013-07-13: 10 mg via INTRAVENOUS

## 2013-07-13 MED ORDER — OXYCODONE-ACETAMINOPHEN 10-325 MG PO TABS: 1.0000 | ORAL_TABLET | Freq: Four times a day (QID) | ORAL | Status: AC | PRN

## 2013-07-13 MED ORDER — GLYCOPYRROLATE 0.2 MG/ML IJ SOLN
INTRAMUSCULAR | Status: DC | PRN
  Administered 2013-07-13: .4 mg via INTRAVENOUS

## 2013-07-13 MED ORDER — MEPERIDINE HCL 25 MG/ML IJ SOLN: 6.2500 mg | INTRAMUSCULAR | Status: DC | PRN

## 2013-07-13 MED ORDER — FENTANYL CITRATE 0.05 MG/ML IJ SOLN
INTRAMUSCULAR | Status: DC | PRN
  Administered 2013-07-13 (×2): 50 ug via INTRAVENOUS
  Administered 2013-07-13: 100 ug via INTRAVENOUS

## 2013-07-13 MED ORDER — PROPOFOL 10 MG/ML IV BOLUS
INTRAVENOUS | Status: DC | PRN
  Administered 2013-07-13: 150 mg via INTRAVENOUS

## 2013-07-13 MED ORDER — ACETAMINOPHEN 650 MG RE SUPP: 650.0000 mg | Freq: Four times a day (QID) | RECTAL | Status: DC | PRN

## 2013-07-13 MED ORDER — OXYCODONE HCL 5 MG/5ML PO SOLN: 5.0000 mg | Freq: Once | ORAL | Status: DC | PRN

## 2013-07-13 MED ORDER — OXYCODONE HCL 5 MG PO TABS
5.0000 mg | ORAL_TABLET | ORAL | Status: DC | PRN
  Administered 2013-07-13 – 2013-07-14 (×3): 5 mg via ORAL
  Filled 2013-07-13 (×3): qty 1

## 2013-07-13 MED ORDER — MIDAZOLAM HCL 2 MG/2ML IJ SOLN
INTRAMUSCULAR | Status: AC
  Filled 2013-07-13: qty 2

## 2013-07-13 MED ORDER — MIDAZOLAM HCL 2 MG/2ML IJ SOLN: 1.0000 mg | INTRAMUSCULAR | Status: DC | PRN

## 2013-07-13 MED ORDER — BUPIVACAINE HCL (PF) 0.25 % IJ SOLN
INTRAMUSCULAR | Status: DC | PRN
  Administered 2013-07-13: 16 mL

## 2013-07-13 MED ORDER — ONDANSETRON HCL 4 MG/2ML IJ SOLN
INTRAMUSCULAR | Status: DC | PRN
  Administered 2013-07-13: 4 mg via INTRAVENOUS

## 2013-07-13 MED ORDER — CEFAZOLIN SODIUM-DEXTROSE 2-3 GM-% IV SOLR: 2.0000 g | INTRAVENOUS | Status: DC

## 2013-07-13 MED ORDER — LACTATED RINGERS IV SOLN
INTRAVENOUS | Status: DC
  Administered 2013-07-13 (×2): via INTRAVENOUS

## 2013-07-13 MED ORDER — MORPHINE SULFATE 2 MG/ML IJ SOLN
2.0000 mg | INTRAMUSCULAR | Status: DC | PRN
  Administered 2013-07-13: 2 mg via INTRAVENOUS
  Filled 2013-07-13: qty 1

## 2013-07-13 MED ORDER — OXYCODONE HCL 5 MG PO TABS: 5.0000 mg | ORAL_TABLET | Freq: Once | ORAL | Status: DC | PRN

## 2013-07-13 SURGICAL SUPPLY — 77 items
ADH SKN CLS APL DERMABOND .7 (GAUZE/BANDAGES/DRESSINGS) ×4
APL SKNCLS STERI-STRIP NONHPOA (GAUZE/BANDAGES/DRESSINGS) ×2
APPLIER CLIP 5 13 M/L LIGAMAX5 (MISCELLANEOUS) ×4
APPLIER CLIP 9.375 MED OPEN (MISCELLANEOUS)
APR CLP MED 9.3 20 MLT OPN (MISCELLANEOUS)
APR CLP MED LRG 5 ANG JAW (MISCELLANEOUS) ×2
BAG SPEC RTRVL LRG 6X4 10 (ENDOMECHANICALS) ×2
BENZOIN TINCTURE PRP APPL 2/3 (GAUZE/BANDAGES/DRESSINGS) ×4 IMPLANT
BINDER BREAST LRG (GAUZE/BANDAGES/DRESSINGS) IMPLANT
BINDER BREAST MEDIUM (GAUZE/BANDAGES/DRESSINGS) IMPLANT
BINDER BREAST XLRG (GAUZE/BANDAGES/DRESSINGS) ×2 IMPLANT
BINDER BREAST XXLRG (GAUZE/BANDAGES/DRESSINGS) IMPLANT
BLADE 15 SAFETY STRL DISP (BLADE) ×4 IMPLANT
BLADE SURG ROTATE 9660 (MISCELLANEOUS) IMPLANT
CANISTER SUC SOCK COL 7IN (MISCELLANEOUS) IMPLANT
CANISTER SUCT 1200ML W/VALVE (MISCELLANEOUS) ×4 IMPLANT
CHLORAPREP W/TINT 26ML (MISCELLANEOUS) ×6 IMPLANT
CLIP APPLIE 5 13 M/L LIGAMAX5 (MISCELLANEOUS) ×2 IMPLANT
CLIP APPLIE 9.375 MED OPEN (MISCELLANEOUS) IMPLANT
CLOSURE WOUND 1/2 X4 (GAUZE/BANDAGES/DRESSINGS) ×1
COVER MAYO STAND STRL (DRAPES) ×4 IMPLANT
COVER PROBE W GEL 5X96 (DRAPES) ×4 IMPLANT
COVER TABLE BACK 60X90 (DRAPES) ×4 IMPLANT
DECANTER SPIKE VIAL GLASS SM (MISCELLANEOUS) ×4 IMPLANT
DERMABOND ADVANCED (GAUZE/BANDAGES/DRESSINGS) ×4
DERMABOND ADVANCED .7 DNX12 (GAUZE/BANDAGES/DRESSINGS) ×2 IMPLANT
DEVICE DUBIN W/COMP PLATE 8390 (MISCELLANEOUS) ×4 IMPLANT
DRAPE C-ARM 42X72 X-RAY (DRAPES) IMPLANT
DRAPE PED LAPAROTOMY (DRAPES) ×4 IMPLANT
DRSG TEGADERM 4X4.75 (GAUZE/BANDAGES/DRESSINGS) IMPLANT
ELECT COATED BLADE 2.86 ST (ELECTRODE) ×4 IMPLANT
ELECT REM PT RETURN 9FT ADLT (ELECTROSURGICAL) ×4
ELECTRODE REM PT RTRN 9FT ADLT (ELECTROSURGICAL) ×2 IMPLANT
FILTER SMOKE EVAC LAPAROSHD (FILTER) ×4 IMPLANT
GLOVE BIO SURGEON STRL SZ7 (GLOVE) ×8 IMPLANT
GLOVE BIOGEL PI IND STRL 7.5 (GLOVE) ×2 IMPLANT
GLOVE BIOGEL PI INDICATOR 7.5 (GLOVE) ×2
GLOVE ECLIPSE 6.5 STRL STRAW (GLOVE) ×4 IMPLANT
GOWN STRL REUS W/ TWL LRG LVL3 (GOWN DISPOSABLE) ×12 IMPLANT
GOWN STRL REUS W/TWL LRG LVL3 (GOWN DISPOSABLE) ×24
HEMOSTAT SNOW SURGICEL 2X4 (HEMOSTASIS) IMPLANT
KIT MARKER MARGIN INK (KITS) ×4 IMPLANT
NDL HYPO 25X1 1.5 SAFETY (NEEDLE) IMPLANT
NEEDLE HYPO 25X1 1.5 SAFETY (NEEDLE) IMPLANT
NS IRRIG 1000ML POUR BTL (IV SOLUTION) ×4 IMPLANT
PACK BASIN DAY SURGERY FS (CUSTOM PROCEDURE TRAY) ×4 IMPLANT
PENCIL BUTTON HOLSTER BLD 10FT (ELECTRODE) ×4 IMPLANT
POUCH SPECIMEN RETRIEVAL 10MM (ENDOMECHANICALS) ×4 IMPLANT
SCISSORS LAP 5X35 DISP (ENDOMECHANICALS) IMPLANT
SET CHOLANGIOGRAPH 5 50 .035 (SET/KITS/TRAYS/PACK) IMPLANT
SET IRRIG TUBING LAPAROSCOPIC (IRRIGATION / IRRIGATOR) ×4 IMPLANT
SHEET MEDIUM DRAPE 40X70 STRL (DRAPES) IMPLANT
SLEEVE ENDOPATH XCEL 5M (ENDOMECHANICALS) ×8 IMPLANT
SLEEVE SCD COMPRESS KNEE MED (MISCELLANEOUS) ×4 IMPLANT
SPECIMEN JAR SMALL (MISCELLANEOUS) ×4 IMPLANT
SPONGE GAUZE 4X4 12PLY STER LF (GAUZE/BANDAGES/DRESSINGS) ×4 IMPLANT
SPONGE LAP 4X18 X RAY DECT (DISPOSABLE) ×4 IMPLANT
STAPLER VISISTAT 35W (STAPLE) ×4 IMPLANT
STRIP CLOSURE SKIN 1/2X4 (GAUZE/BANDAGES/DRESSINGS) ×3 IMPLANT
SUT MNCRL AB 4-0 PS2 18 (SUTURE) ×6 IMPLANT
SUT MON AB 5-0 PS2 18 (SUTURE) IMPLANT
SUT SILK 2 0 SH (SUTURE) IMPLANT
SUT VIC AB 2-0 SH 27 (SUTURE) ×4
SUT VIC AB 2-0 SH 27XBRD (SUTURE) ×2 IMPLANT
SUT VIC AB 3-0 SH 27 (SUTURE) ×4
SUT VIC AB 3-0 SH 27X BRD (SUTURE) ×2 IMPLANT
SUT VIC AB 5-0 PS2 18 (SUTURE) IMPLANT
SUT VICRYL 0 UR6 27IN ABS (SUTURE) ×2 IMPLANT
SYR CONTROL 10ML LL (SYRINGE) ×4 IMPLANT
TOWEL OR 17X24 6PK STRL BLUE (TOWEL DISPOSABLE) ×4 IMPLANT
TOWEL OR NON WOVEN STRL DISP B (DISPOSABLE) ×4 IMPLANT
TRAY LAPAROSCOPIC (CUSTOM PROCEDURE TRAY) ×4 IMPLANT
TROCAR XCEL BLUNT TIP 100MML (ENDOMECHANICALS) ×4 IMPLANT
TROCAR XCEL NON-BLD 5MMX100MML (ENDOMECHANICALS) ×4 IMPLANT
TUBE CONNECTING 20'X1/4 (TUBING) ×1
TUBE CONNECTING 20X1/4 (TUBING) ×3 IMPLANT
YANKAUER SUCT BULB TIP NO VENT (SUCTIONS) IMPLANT

## 2013-07-13 NOTE — H&P (View-Only) (Signed)
Subjective:     Patient ID: Shelby [name], female   DOB: 11/09/1970, 43 y.o.   MRN: [id number]  HPI This is a 43-year-old female last all last week for a breast complaint. She did not tell me that data she also was being evaluated for gallbladder. She has been having pain in her epigastrium through to her back that lasts a number of hours. It is not always related eating. She also has heartburn that is better with protonix. She still has this pain in her epigastrium to go straight to her back at night primarily. She underwent an evaluation with gastroenterology. She underwent an ultrasound that showed multiple echogenic shadowing stones there was otherwise normal. She comes in today to discuss her gallbladder.  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 I told her this would likely relieve some of her symptoms to some degree.  We will plan on doing in combination with her breast surgery I discussed the procedure in detail.  We discussed the risks and benefits of a laparoscopic cholecystectomy and possible cholangiogram including, but not limited to bleeding, infection, injury to surrounding structures such as the intestine or liver, bile leak, retained gallstones, need to convert to an open procedure, prolonged diarrhea, blood clots such as  DVT, common bile duct injury, anesthesia risks, and possible need for additional procedures.  The likelihood of improvement in symptoms and return to the patient's normal status is good. We discussed the typical post-operative recovery course.        

## 2013-07-13 NOTE — Discharge Instructions (Signed)
[facility] °Office Phone Number [phone] ° °BREAST BIOPSY/ PARTIAL MASTECTOMY: POST OP INSTRUCTIONS ° °Always review your discharge instruction sheet given to you by the facility where your surgery was performed. ° °IF YOU HAVE DISABILITY OR FAMILY LEAVE FORMS, YOU MUST BRING THEM TO THE OFFICE FOR PROCESSING.  DO NOT GIVE THEM TO YOUR DOCTOR. ° °1. A prescription for pain medication may be given to you upon discharge.  Take your pain medication as prescribed, if needed.  If narcotic pain medicine is not needed, then you may take acetaminophen (Tylenol), naprosyn (Alleve) or ibuprofen (Advil) as needed. °2. Take your usually prescribed medications unless otherwise directed °3. If you need a refill on your pain medication, please contact your pharmacy.  They will contact our office to request authorization.  Prescriptions will not be filled after 5pm or on week-ends. °4. You should eat very light the first 24 hours after surgery, such as soup, crackers, pudding, etc.  Resume your normal diet the day after surgery. °5. Most patients will experience some swelling and bruising in the breast.  Ice packs and a good support bra will help.  Wear the breast binder provided or a sports bra for 72 hours day and night.  After that wear a sports bra during the day until you return to the office. Swelling and bruising can take several days to resolve.  °6. It is common to experience some constipation if taking pain medication after surgery.  Increasing fluid intake and taking a stool softener will usually help or prevent this problem from occurring.  A mild laxative (Milk of Magnesia or Miralax) should be taken according to package directions if there are no bowel movements after 48 hours. °7. Unless discharge instructions indicate otherwise, you may remove your bandages 48 hours after surgery and you may shower at that time.  You may have steri-strips (small skin tapes) in place directly over the incision.   These strips should be left on the skin for 7-10 days and will come off on their own.  If your surgeon used skin glue on the incision, you may shower in 24 hours.  The glue will flake off over the next 2-3 weeks.  Any sutures or staples will be removed at the office during your follow-up visit. °8. ACTIVITIES:  You may resume regular daily activities (gradually increasing) beginning the next day.  Wearing a good support bra or sports bra minimizes pain and swelling.  You may have sexual intercourse when it is comfortable. °a. You may drive when you no longer are taking prescription pain medication, you can comfortably wear a seatbelt, and you can safely maneuver your car and apply brakes. °b. RETURN TO WORK:  ______________________________________________________________________________________ °9. You should see your doctor in the office for a follow-up appointment approximately two weeks after your surgery.  Your doctor’s nurse will typically make your follow-up appointment when she calls you with your pathology report.  Expect your pathology report 3-4 business days after your surgery.  You may call to check if you do not hear from us after three days. °10. OTHER INSTRUCTIONS: _______________________________________________________________________________________________ _____________________________________________________________________________________________________________________________________ °_____________________________________________________________________________________________________________________________________ °_____________________________________________________________________________________________________________________________________ ° °WHEN TO CALL DR Jayquon Theiler: °1. Fever over 101.0 °2. Nausea and/or vomiting. °3. Extreme swelling or bruising. °4. Continued bleeding from incision. °5. Increased pain, redness, or drainage from the incision. ° °The clinic staff is available to  answer your questions during regular business hours.  Please don’t hesitate to call and ask to speak to one of the nurses for   clinical concerns.  If you have a medical emergency, go to the nearest emergency room or call 911.  A surgeon from [facility] is always on call at the hospital.  For further questions, please visit [email or web address] LAPAROSCOPIC SURGERY: POST OP INSTRUCTIONS  Always review your discharge instruction sheet given to you by the facility where your surgery was performed. IF YOU HAVE DISABILITY OR FAMILY LEAVE FORMS, YOU MUST BRING THEM TO THE OFFICE FOR PROCESSING.   DO NOT GIVE THEM TO YOUR DOCTOR.  11. A prescription for pain medication may be given to you upon discharge.  Take your pain medication as prescribed, if needed.  If narcotic pain medicine is not needed, then you may take acetaminophen (Tylenol), naprosyn (Alleve), or ibuprofen (Advil) as needed. 12. Take your usually prescribed medications unless otherwise directed. 13. If you need a refill on your pain medication, please contact your pharmacy.  They will contact our office to request authorization. Prescriptions will not be filled after 5pm or on week-ends. 14. You should follow a light diet the first few days after arrival home, such as soup and crackers, etc.  Be sure to include lots of fluids daily. 15. Most patients will experience some swelling and bruising in the area of the incisions.  Ice packs will help.  Swelling and bruising can take several days to resolve.  16. It is common to experience some constipation if taking pain medication after surgery.  Increasing fluid intake and taking a stool softener (such as Colace) will usually help or prevent this problem from occurring.  A mild laxative (Milk of Magnesia or Miralax) should be taken according to package instructions if there are no bowel movements after 48  hours. 17. Unless discharge instructions indicate otherwise, you may remove your bandages 48 hours after surgery, and you may shower at that time.  You may have steri-strips (small skin tapes) in place directly over the incision.  These strips should be left on the skin for 7-10 days.  If your surgeon used skin glue on the incision, you may shower in 24 hours.  The glue will flake off over the next 2-3 weeks.  Any sutures or staples will be removed at the office during your follow-up visit. 18. ACTIVITIES:  You may resume regular (light) daily activities beginning the next day--such as daily self-care, walking, climbing stairs--gradually increasing activities as tolerated.  You may have sexual intercourse when it is comfortable.  Refrain from any heavy lifting or straining until approved by your doctor. a. You may drive when you are no longer taking prescription pain medication, you can comfortably wear a seatbelt, and you can safely maneuver your car and apply brakes. b. RETURN TO WORK:  __________________________________________________________ 19. You should see your doctor in the office for a follow-up appointment approximately 2-3 weeks after your surgery.  Make sure that you call for this appointment within a day or two after you arrive home to insure a convenient appointment time. 20. OTHER INSTRUCTIONS: __________________________________________________________________________________________________________________________ __________________________________________________________________________________________________________________________ WHEN TO CALL YOUR DOCTOR: 6. Fever over 101.0 7. Inability to urinate 8. Continued bleeding from incision. 9. Increased pain, redness, or drainage from the incision. 10. Increasing abdominal pain  The clinic staff is available to answer your questions during regular business hours.  Please dont hesitate to call and ask to speak to one of the nurses for  clinical concerns.  If you have a medical emergency, go to the nearest emergency room or call 911.  A surgeon from [facility] is always on call at the hospital. [address], [place], [place], [place]  [address] ? [address], [place], [place]   [address] [phone] ? [phone] ? FAX [phone] Web site: [email or web address]

## 2013-07-13 NOTE — Interval H&P Note (Signed)
History and Physical Interval Note:  07/13/2013 3:35 PM  Shelby [name]  has presented today for surgery, with the diagnosis of nipple discharge   The various methods of treatment have been discussed with the patient and family. After consideration of risks, benefits and other options for treatment, the patient has consented to  Procedure(s): RADIOACTIVE SEED GUIDED EXCISIONAL BREAST BIOPSY (Left) LAPAROSCOPIC CHOLECYSTECTOMY (N/A) as a surgical intervention .  The patient's history has been reviewed, patient examined, no change in status, stable for surgery.  I have reviewed the patient's chart and labs.  Questions were answered to the patient's satisfaction.     Rolm Bookbinder

## 2013-07-13 NOTE — Transfer of Care (Signed)
Immediate Anesthesia Transfer of Care Note  Patient: Shelby [name]  Procedure(s) Performed: Procedure(s): RADIOACTIVE SEED GUIDED EXCISIONAL BREAST BIOPSY (Left) LAPAROSCOPIC CHOLECYSTECTOMY (N/A)  Patient Location: PACU  Anesthesia Type:General  Level of Consciousness: awake and alert   Airway & Oxygen Therapy: Patient Spontanous Breathing and Patient connected to face mask oxygen  Post-op Assessment: Report given to PACU RN and Post -op Vital signs reviewed and stable  Post vital signs: Reviewed and stable  Complications: No apparent anesthesia complications

## 2013-07-13 NOTE — Op Note (Signed)
Preoperative diagnoses: #1 symptomatic cholelithiasis #2 left nipple discharge with left breast microcalcifications Postoperative diagnosis: Same as above Procedure: #1 left breast radioactive seed guided excision of calcifications and duct excision #2 laparoscopic cholecystectomy Surgeon: Dr. Serita Grammes Anesthesia: Gen. Estimated blood loss: Minimal Complications: None Drains: None Specimens: #1 left breast tissue with radioactive seed marked with paint #2 left breast duct excision marked short stitch superior, long stitch lateral, double stitch deep #3 gallbladder and contents Sponge and needle count correct at completion Disposition to recovery stable  Indications: This is a 43 year old female who I saw initially for left nipple discharge and microcalcifications. The biopsy was benign but she was referred for evaluation due to the concern. We discussed a left breast radioactive seed guided biopsy of the calcifications along with a duct excision at the same time. These appear to be associated on her preoperative evaluation. During this time she also began having symptoms and she was evaluated by gastroenterology and ends up having gallstones. She pretty clearly had symptoms consistent with symptomatic cholelithiasis we discussed proceeding with a laparoscopic cholecystectomy at the same time.  Procedure: She first had a radioactive seed placed at the breast Center by Dr. Rosana Hoes. After informed consent was obtained she was then taken to the operating room. She was given cefazolin. Sequential compression devices on her legs. She was placed under general anesthesia without complication. Her left breast and abdomen were then prepped and draped in the standard sterile surgical fashion. A surgical timeout was then performed.  I first did the breast procedure. I located the radioactive seed with the neoprobe. I then made a curvilinear incision in the upper outer portion of the left breast. I  then used the neoprobe to guide the excision of the see any surrounding tissue. This was confirmed by the neoprobe. This was painted. This was then taken for mammogram which confirmed removal of the clip and the seed. This was confirmed by radiology. This was then sent to pathology. I accessed the discharging duct with a lacrimal duct probe. This did track towards the area where the seed was. I then excised the ductal system that led towards the nipple areolar complex and marked this with a stitch as above. Hemostasis was observed. I closed the breast tissue with a 2-0 Vicryl. The dermis was closed with 3-0 Vicryl the skin with 4 Monocryl. Dermabond and Steri-Strips are placed. Marcaine was infiltrated throughout this wound.  I then turned my attention to the gallbladder. I infiltrated Marcaine below the umbilicus. I then made a vertical incision. I grasped the fascia with Kocher clamps. I then incised the fascia and entered into the peritoneum without injury. I then placed a 0 Vicryl pursestring suture through the fascia. I saw trocar was inserted and the abdomen was insufflated to 15 mmHg pressure. I then inserted 3 further 5 mm trocars in the epigastrium and right upper quadrant under direct vision without difficulty. The gallbladder was then retracted cephalad. There were some adhesions noted from the duodenum as well as the omentum. These were taken down bluntly. The gallbladder was then retracted cephalad and lateral. I was able to obtain the critical view of safety. I clipped an anterior and a posterior branch of the artery and divided them leaving 2 clips in place. These clips completely crossed the vessels.I then placed 2 clips proximally on the duct and one clip distally. The duct was viable and these clips completely crossed it. I then divided the duct. The gallbladder was then removed from  the liver bed with cautery. This was placed in a bag and removed. I then obtained hemostasis. Irrigation was  performed and this was all clear. I then removed my hasson trocar. I tied my umbilical stitch down. I did place an additional 0 Vicryl figure-of-eight suture to completely obliterate that defect. I then desufflated the abdomen and removed all my trocars. I then closed these with 4 Monocryl Dermabond and Steri-Strips. She tolerated this well was extubated and transferred to recovery stable.

## 2013-07-13 NOTE — Anesthesia Postprocedure Evaluation (Signed)
  Anesthesia Post-op Note  Patient: Shelby [name]  Procedure(s) Performed: Procedure(s): RADIOACTIVE SEED GUIDED EXCISIONAL BREAST BIOPSY (Left) LAPAROSCOPIC CHOLECYSTECTOMY (N/A)  Patient Location: PACU  Anesthesia Type:General  Level of Consciousness: awake  Airway and Oxygen Therapy: Patient Spontanous Breathing  Post-op Pain: mild  Post-op Assessment: Post-op Vital signs reviewed  Post-op Vital Signs: Reviewed  Last Vitals:  Filed Vitals:   07/13/13 1830  BP: 134/76  Pulse: 99  Temp:   Resp: 16    Complications: No apparent anesthesia complications

## 2013-07-13 NOTE — Anesthesia Procedure Notes (Signed)
Procedure Name: Intubation Date/Time: 07/13/2013 4:22 PM Performed by: Melynda Ripple D Pre-anesthesia Checklist: Patient identified, Emergency Drugs available, Suction available and Patient being monitored Patient Re-evaluated:Patient Re-evaluated prior to inductionOxygen Delivery Method: Circle System Utilized Preoxygenation: Pre-oxygenation with 100% oxygen Intubation Type: IV induction Ventilation: Mask ventilation without difficulty Laryngoscope Size: Mac and 3 Grade View: Grade I Tube type: Oral Number of attempts: 1 Airway Equipment and Method: stylet and oral airway Placement Confirmation: ETT inserted through vocal cords under direct vision,  positive ETCO2 and breath sounds checked- equal and bilateral Secured at: 23 cm Tube secured with: Tape Dental Injury: Teeth and Oropharynx as per pre-operative assessment

## 2013-07-13 NOTE — Anesthesia Preprocedure Evaluation (Addendum)
Anesthesia Evaluation  Patient identified by MRN, date of birth, ID band Patient awake    Reviewed: Allergy & Precautions, H&P , NPO status , Patient's Chart, lab work & pertinent test results  History of Anesthesia Complications Negative for: history of anesthetic complications  Airway Mallampati: II TM Distance: >3 FB Neck ROM: Full    Dental  (+) Teeth Intact, Dental Advisory Given   Pulmonary former smoker,  breath sounds clear to auscultation  Pulmonary exam normal       Cardiovascular negative cardio ROS  Rhythm:Regular Rate:Normal     Neuro/Psych negative neurological ROS     GI/Hepatic Neg liver ROS, GERD-  Medicated and Controlled,  Endo/Other  negative endocrine ROS  Renal/GU negative Renal ROS     Musculoskeletal   Abdominal   Peds  Hematology negative hematology ROS (+)   Anesthesia Other Findings   Reproductive/Obstetrics LMP 2 days ago                          Anesthesia Physical Anesthesia Plan  ASA: II  Anesthesia Plan: General   Post-op Pain Management:    Induction: Intravenous  Airway Management Planned: Oral ETT  Additional Equipment:   Intra-op Plan:   Post-operative Plan: Extubation in OR  Informed Consent: I have reviewed the patients History and Physical, chart, labs and discussed the procedure including the risks, benefits and alternatives for the proposed anesthesia with the patient or authorized representative who has indicated his/her understanding and acceptance.   Dental advisory given  Plan Discussed with: CRNA and Surgeon  Anesthesia Plan Comments: (Plan routine monitors, GETA)        Anesthesia Quick Evaluation

## 2013-07-14 NOTE — Discharge Summary (Signed)
Physician Discharge Summary  Patient ID: ASHTAN LATON MRN: [id number] DOB/AGE: January 03, 1971 43 y.o.  Admit date: 07/13/2013 Discharge date: 07/14/2013  Admission Diagnoses: Biliary colic Left breast mm abnormality Smoking  Discharge Diagnoses:  Active Problems:   S/P laparoscopic cholecystectomy   Discharged Condition: good  Hospital Course: 81 yof who had biliary colic and a abnormal left breast mammogram.  She underwent a laparoscopic cholecystectomy and a left breast radioactive seed guided excision.  She has done well overnight and will be discharged home.  Consults: None  Significant Diagnostic Studies: none  Treatments: surgery: lap chole, left breast seed guided excision and duct excision    Disposition: 01-Home or Self Care     Medication List         citalopram 10 MG tablet  Commonly known as:  CELEXA     oxyCODONE-acetaminophen 10-325 MG per tablet  Commonly known as:  PERCOCET  Take 1 tablet by mouth every 6 (six) hours as needed for pain.     pantoprazole 40 MG tablet  Commonly known as:  PROTONIX           Follow-up Information   Follow up with Frady Taddeo, MD In 3 weeks.   Specialty:  General Surgery   Contact information:   [address] [place] [address] [phone]       Signed: Rolm Bookbinder 07/14/2013, 8:38 AM

## 2013-07-15 ENCOUNTER — Encounter (HOSPITAL_BASED_OUTPATIENT_CLINIC_OR_DEPARTMENT_OTHER): Payer: Self-pay | Admitting: General Surgery

## 2013-07-16 ENCOUNTER — Telehealth (INDEPENDENT_AMBULATORY_CARE_PROVIDER_SITE_OTHER): Payer: Self-pay

## 2013-07-16 NOTE — Telephone Encounter (Signed)
Message copied by Illene Regulus on Fri Jul 16, 2013  2:35 PM ------      Message from: Joya San      Created: Thu Jul 15, 2013 10:56 AM      Contact: [phone]       Pt was asking to get copies of the picture with the gallstones on it. Dr. Donne Hazel gave her a picture but it was not the one she wanted. Please call pt. ------

## 2013-07-16 NOTE — Telephone Encounter (Signed)
LMOM asking for pt to call me back. I need for the pt to leave her email address so I can forward some pictures to her from her surgical procedure.

## 2013-07-26 ENCOUNTER — Telehealth (INDEPENDENT_AMBULATORY_CARE_PROVIDER_SITE_OTHER): Payer: Self-pay | Admitting: General Surgery

## 2013-07-26 NOTE — Telephone Encounter (Signed)
Sounds good

## 2013-07-26 NOTE — Telephone Encounter (Signed)
Patient s/p lap chole and lt breast excision on 5/19/ w/ Dr. Donne Hazel.  Pt called in to let us know steri-strips are peeling off breast incision and it is opened up slightly to where it looks like a cut.  She informed me there are still 2 steri strips left in place.  Informed her to leave the steri strips there and that she could cover the incision with a large enough band aid or gauze to help prevent anything getting in there or further skin breakdown from her wearing her bra.  Informed her to keep showering every day and letting the warm soapy water run over the incision.  Informed her to give [place] a call back if it seems to be getting worse including: fever, chills, drainage, redness, inflammation. Patient verbalized understanding of this plan.

## 2013-07-29 ENCOUNTER — Encounter (INDEPENDENT_AMBULATORY_CARE_PROVIDER_SITE_OTHER): Payer: Self-pay | Admitting: General Surgery

## 2013-07-29 ENCOUNTER — Ambulatory Visit (INDEPENDENT_AMBULATORY_CARE_PROVIDER_SITE_OTHER): Payer: BC Managed Care – PPO | Admitting: General Surgery

## 2013-07-29 VITALS — BP 130/70 | HR 72 | Resp 18 | Ht 63.5 in | Wt 159.0 lb

## 2013-07-29 DIAGNOSIS — Z09 Encounter for follow-up examination after completed treatment for conditions other than malignant neoplasm: Secondary | ICD-10-CM

## 2013-08-03 NOTE — Progress Notes (Signed)
Subjective:     Patient ID: Shelby [name], female   DOB: 1970-12-15, 43 y.o.   MRN: [id number]  HPI This is a 43 year old female status post a laparoscopic cholecystectomy and a left breast excisional biopsy with seed guidance She is doing well today he returns without any complaints. She has no complaints either breast or from her gallbladder. Her pathology of the breast shows intraductal papillomas with no other findings. Her pathology from her gallbladder shows chronic cholecystitis and cholelithiasis.  Review of Systems     Objective:   Physical Exam    Healing left breast incision without infection Healing abdominal incisions without infection and nontender Assessment:     Status post laparoscopic cholecystectomy left breast excisional biopsy     Plan:     She's doing well and has the expected postoperative course. I will plan on seeing her back as needed. I provided her a copy of her pathology today.

## 2014-04-20 ENCOUNTER — Other Ambulatory Visit: Payer: Self-pay | Admitting: Gynecology

## 2014-04-21 ENCOUNTER — Other Ambulatory Visit: Payer: Self-pay | Admitting: Obstetrics and Gynecology

## 2014-04-21 DIAGNOSIS — R921 Mammographic calcification found on diagnostic imaging of breast: Secondary | ICD-10-CM

## 2014-04-21 LAB — CYTOLOGY - PAP

## 2015-05-16 ENCOUNTER — Other Ambulatory Visit: Payer: Self-pay | Admitting: Obstetrics and Gynecology

## 2015-05-16 DIAGNOSIS — R928 Other abnormal and inconclusive findings on diagnostic imaging of breast: Secondary | ICD-10-CM

## 2015-05-23 ENCOUNTER — Other Ambulatory Visit: Payer: Self-pay | Admitting: Obstetrics and Gynecology

## 2015-05-23 ENCOUNTER — Ambulatory Visit
Admission: RE | Admit: 2015-05-23 | Discharge: 2015-05-23 | Disposition: A | Discharge status: Home / Self Care | Payer: BLUE CROSS/BLUE SHIELD | Source: Ambulatory Visit | Attending: Obstetrics and Gynecology | Admitting: Obstetrics and Gynecology

## 2015-05-23 DIAGNOSIS — N6452 Nipple discharge: Secondary | ICD-10-CM

## 2015-05-23 DIAGNOSIS — N6310 Unspecified lump in the right breast, unspecified quadrant: Secondary | ICD-10-CM

## 2015-05-23 DIAGNOSIS — R928 Other abnormal and inconclusive findings on diagnostic imaging of breast: Secondary | ICD-10-CM

## 2015-05-24 ENCOUNTER — Other Ambulatory Visit: Payer: Self-pay | Admitting: Obstetrics and Gynecology

## 2015-05-24 ENCOUNTER — Ambulatory Visit
Admission: RE | Admit: 2015-05-24 | Discharge: 2015-05-24 | Disposition: A | Discharge status: Home / Self Care | Payer: BLUE CROSS/BLUE SHIELD | Source: Ambulatory Visit | Attending: Obstetrics and Gynecology | Admitting: Obstetrics and Gynecology

## 2015-05-24 DIAGNOSIS — N6310 Unspecified lump in the right breast, unspecified quadrant: Secondary | ICD-10-CM

## 2015-06-29 ENCOUNTER — Other Ambulatory Visit: Payer: Self-pay | Admitting: General Surgery

## 2015-06-29 DIAGNOSIS — D241 Benign neoplasm of right breast: Secondary | ICD-10-CM

## 2015-07-06 ENCOUNTER — Other Ambulatory Visit: Payer: Self-pay | Admitting: General Surgery

## 2015-07-06 DIAGNOSIS — D241 Benign neoplasm of right breast: Secondary | ICD-10-CM

## 2015-07-10 ENCOUNTER — Encounter (HOSPITAL_BASED_OUTPATIENT_CLINIC_OR_DEPARTMENT_OTHER): Payer: Self-pay | Admitting: *Deleted

## 2015-07-10 NOTE — Progress Notes (Addendum)
Discussed with Dr. Marcie Bal that pt uses condoms and stated she could be pregnant - do urine pregnancy with Bmet, CBC and Diff. Pt told me she is suppose to take Iron but she does not- she says she does not like to take medications.

## 2015-07-11 ENCOUNTER — Encounter (HOSPITAL_BASED_OUTPATIENT_CLINIC_OR_DEPARTMENT_OTHER)
Admission: RE | Admit: 2015-07-11 | Discharge: 2015-07-11 | Disposition: A | Discharge status: Home / Self Care | Payer: BLUE CROSS/BLUE SHIELD | Source: Ambulatory Visit | Attending: General Surgery | Admitting: General Surgery

## 2015-07-11 DIAGNOSIS — D241 Benign neoplasm of right breast: Secondary | ICD-10-CM | POA: Diagnosis not present

## 2015-07-11 DIAGNOSIS — Z01812 Encounter for preprocedural laboratory examination: Secondary | ICD-10-CM | POA: Insufficient documentation

## 2015-07-11 LAB — BASIC METABOLIC PANEL
ANION GAP: 9 (ref 5–15)
BUN: 7 mg/dL (ref 6–20)
CHLORIDE: 107 mmol/L (ref 101–111)
CO2: 25 mmol/L (ref 22–32)
Calcium: 9.7 mg/dL (ref 8.9–10.3)
Creatinine, Ser: 0.56 mg/dL (ref 0.44–1.00)
GFR calc Af Amer: >60 mL/min (ref >60)
Glucose, Bld: 105 mg/dL — ABNORMAL HIGH (ref 65–99)
POTASSIUM: 3.4 mmol/L — AB (ref 3.5–5.1)
SODIUM: 141 mmol/L (ref 135–145)

## 2015-07-11 LAB — CBC WITH DIFFERENTIAL/PLATELET
BASOS ABS: 0 10*3/uL (ref 0.0–0.1)
BASOS PCT: 1 %
EOS ABS: 0.1 10*3/uL (ref 0.0–0.7)
EOS PCT: 1 %
HCT: 34.9 % — ABNORMAL LOW (ref 36.0–46.0)
Hemoglobin: 12.1 g/dL (ref 12.0–15.0)
LYMPHS ABS: 1.9 10*3/uL (ref 0.7–4.0)
LYMPHS PCT: 27 %
MCH: 28.8 pg (ref 26.0–34.0)
MCHC: 34.7 g/dL (ref 30.0–36.0)
MCV: 83.1 fL (ref 78.0–100.0)
Monocytes Absolute: 0.4 10*3/uL (ref 0.1–1.0)
Monocytes Relative: 6 %
NEUTROS ABS: 4.6 10*3/uL (ref 1.7–7.7)
Neutrophils Relative %: 65 %
PLATELETS: 301 10*3/uL (ref 150–400)
RBC: 4.2 MIL/uL (ref 3.87–5.11)
RDW: 12.5 % (ref 11.5–15.5)
WBC: 7 10*3/uL (ref 4.0–10.5)

## 2015-07-11 LAB — PREGNANCY, URINE: PREG TEST UR: NEGATIVE

## 2015-07-11 NOTE — Progress Notes (Signed)
Pt given 8oz carton of boost breeze to drink at 615 am morning of surgery along with written instructions.  Explained to pt this is the only liquid she could have after midnight. Pt voiced understanding.  She was given orange flavor

## 2015-07-14 ENCOUNTER — Ambulatory Visit
Admission: RE | Admit: 2015-07-14 | Discharge: 2015-07-14 | Disposition: A | Discharge status: Home / Self Care | Payer: BLUE CROSS/BLUE SHIELD | Source: Ambulatory Visit | Attending: General Surgery | Admitting: General Surgery

## 2015-07-14 DIAGNOSIS — D241 Benign neoplasm of right breast: Secondary | ICD-10-CM

## 2015-07-17 ENCOUNTER — Ambulatory Visit (HOSPITAL_BASED_OUTPATIENT_CLINIC_OR_DEPARTMENT_OTHER): Payer: BLUE CROSS/BLUE SHIELD | Admitting: Anesthesiology

## 2015-07-17 ENCOUNTER — Encounter (HOSPITAL_BASED_OUTPATIENT_CLINIC_OR_DEPARTMENT_OTHER)
Admission: RE | Disposition: A | Discharge status: Home / Self Care | Payer: Self-pay | Source: Ambulatory Visit | Attending: General Surgery

## 2015-07-17 ENCOUNTER — Encounter (HOSPITAL_BASED_OUTPATIENT_CLINIC_OR_DEPARTMENT_OTHER): Payer: Self-pay

## 2015-07-17 ENCOUNTER — Ambulatory Visit
Admission: RE | Admit: 2015-07-17 | Discharge: 2015-07-17 | Disposition: A | Discharge status: Home / Self Care | Payer: BLUE CROSS/BLUE SHIELD | Source: Ambulatory Visit | Attending: General Surgery | Admitting: General Surgery

## 2015-07-17 ENCOUNTER — Ambulatory Visit (HOSPITAL_BASED_OUTPATIENT_CLINIC_OR_DEPARTMENT_OTHER)
Admission: RE | Admit: 2015-07-17 | Discharge: 2015-07-17 | Disposition: A | Discharge status: Home / Self Care | Payer: BLUE CROSS/BLUE SHIELD | Source: Ambulatory Visit | Attending: General Surgery | Admitting: General Surgery

## 2015-07-17 DIAGNOSIS — Z6833 Body mass index (BMI) 33.0-33.9, adult: Secondary | ICD-10-CM | POA: Insufficient documentation

## 2015-07-17 DIAGNOSIS — Z87891 Personal history of nicotine dependence: Secondary | ICD-10-CM | POA: Diagnosis not present

## 2015-07-17 DIAGNOSIS — D241 Benign neoplasm of right breast: Secondary | ICD-10-CM

## 2015-07-17 DIAGNOSIS — N6452 Nipple discharge: Secondary | ICD-10-CM | POA: Diagnosis present

## 2015-07-17 HISTORY — DX: Anxiety disorder, unspecified: F41.9

## 2015-07-17 HISTORY — DX: Myoneural disorder, unspecified: G70.9

## 2015-07-17 HISTORY — PX: RADIOACTIVE SEED GUIDED EXCISIONAL BREAST BIOPSY: SHX6490

## 2015-07-17 HISTORY — PX: BREAST EXCISIONAL BIOPSY: SUR124

## 2015-07-17 SURGERY — RADIOACTIVE SEED GUIDED BREAST BIOPSY
Anesthesia: General | Site: Breast | Laterality: Right

## 2015-07-17 MED ORDER — PROPOFOL 10 MG/ML IV BOLUS
INTRAVENOUS | Status: DC | PRN
  Administered 2015-07-17: 150 mg via INTRAVENOUS

## 2015-07-17 MED ORDER — CEFAZOLIN SODIUM-DEXTROSE 2-4 GM/100ML-% IV SOLN
2.0000 g | INTRAVENOUS | Status: AC
  Administered 2015-07-17: 2 g via INTRAVENOUS

## 2015-07-17 MED ORDER — PROPOFOL 10 MG/ML IV BOLUS
INTRAVENOUS | Status: AC
  Filled 2015-07-17: qty 20

## 2015-07-17 MED ORDER — LIDOCAINE 2% (20 MG/ML) 5 ML SYRINGE
INTRAMUSCULAR | Status: AC
  Filled 2015-07-17: qty 5

## 2015-07-17 MED ORDER — ACETAMINOPHEN 325 MG PO TABS: 325.0000 mg | ORAL_TABLET | ORAL | Status: DC | PRN

## 2015-07-17 MED ORDER — FENTANYL CITRATE (PF) 100 MCG/2ML IJ SOLN
50.0000 ug | INTRAMUSCULAR | Status: DC | PRN
  Administered 2015-07-17: 80 ug via INTRAVENOUS
  Administered 2015-07-17: 100 ug via INTRAVENOUS

## 2015-07-17 MED ORDER — DEXAMETHASONE SODIUM PHOSPHATE 10 MG/ML IJ SOLN
INTRAMUSCULAR | Status: AC
  Filled 2015-07-17: qty 1

## 2015-07-17 MED ORDER — DEXAMETHASONE SODIUM PHOSPHATE 4 MG/ML IJ SOLN
INTRAMUSCULAR | Status: DC | PRN
  Administered 2015-07-17: 10 mg via INTRAVENOUS

## 2015-07-17 MED ORDER — LIDOCAINE 2% (20 MG/ML) 5 ML SYRINGE
INTRAMUSCULAR | Status: DC | PRN
  Administered 2015-07-17: 80 mg via INTRAVENOUS

## 2015-07-17 MED ORDER — BUPIVACAINE HCL (PF) 0.25 % IJ SOLN
INTRAMUSCULAR | Status: DC | PRN
  Administered 2015-07-17: 10 mL

## 2015-07-17 MED ORDER — ONDANSETRON HCL 4 MG/2ML IJ SOLN
INTRAMUSCULAR | Status: DC | PRN
  Administered 2015-07-17: 4 mg via INTRAVENOUS

## 2015-07-17 MED ORDER — SCOPOLAMINE 1 MG/3DAYS TD PT72: 1.0000 | MEDICATED_PATCH | Freq: Once | TRANSDERMAL | Status: DC | PRN

## 2015-07-17 MED ORDER — MIDAZOLAM HCL 2 MG/2ML IJ SOLN
1.0000 mg | INTRAMUSCULAR | Status: DC | PRN
  Administered 2015-07-17: 2 mg via INTRAVENOUS

## 2015-07-17 MED ORDER — GLYCOPYRROLATE 0.2 MG/ML IJ SOLN: 0.2000 mg | Freq: Once | INTRAMUSCULAR | Status: DC | PRN

## 2015-07-17 MED ORDER — MIDAZOLAM HCL 2 MG/2ML IJ SOLN
INTRAMUSCULAR | Status: AC
  Filled 2015-07-17: qty 2

## 2015-07-17 MED ORDER — HYDROCODONE-ACETAMINOPHEN 10-325 MG PO TABS: 1.0000 | ORAL_TABLET | Freq: Four times a day (QID) | ORAL | Status: AC | PRN

## 2015-07-17 MED ORDER — LACTATED RINGERS IV SOLN
INTRAVENOUS | Status: DC
  Administered 2015-07-17 (×2): via INTRAVENOUS

## 2015-07-17 MED ORDER — FENTANYL CITRATE (PF) 100 MCG/2ML IJ SOLN
INTRAMUSCULAR | Status: AC
Start: 2015-07-17 — End: 2015-07-17
  Filled 2015-07-17: qty 2

## 2015-07-17 MED ORDER — FENTANYL CITRATE (PF) 100 MCG/2ML IJ SOLN: 25.0000 ug | INTRAMUSCULAR | Status: DC | PRN

## 2015-07-17 MED ORDER — ONDANSETRON HCL 4 MG/2ML IJ SOLN
INTRAMUSCULAR | Status: AC
  Filled 2015-07-17: qty 2

## 2015-07-17 MED ORDER — CEFAZOLIN SODIUM-DEXTROSE 2-4 GM/100ML-% IV SOLN
INTRAVENOUS | Status: AC
  Filled 2015-07-17: qty 100

## 2015-07-17 MED ORDER — ACETAMINOPHEN 160 MG/5ML PO SOLN: 325.0000 mg | ORAL | Status: DC | PRN

## 2015-07-17 MED ORDER — OXYCODONE HCL 5 MG/5ML PO SOLN: 5.0000 mg | Freq: Once | ORAL | Status: DC | PRN

## 2015-07-17 MED ORDER — OXYCODONE HCL 5 MG PO TABS: 5.0000 mg | ORAL_TABLET | Freq: Once | ORAL | Status: DC | PRN

## 2015-07-17 SURGICAL SUPPLY — 61 items
APPLIER CLIP 9.375 MED OPEN (MISCELLANEOUS)
APR CLP MED 9.3 20 MLT OPN (MISCELLANEOUS)
BINDER BREAST LRG (GAUZE/BANDAGES/DRESSINGS) IMPLANT
BINDER BREAST MEDIUM (GAUZE/BANDAGES/DRESSINGS) IMPLANT
BINDER BREAST XLRG (GAUZE/BANDAGES/DRESSINGS) ×2 IMPLANT
BINDER BREAST XXLRG (GAUZE/BANDAGES/DRESSINGS) IMPLANT
BLADE SURG 15 STRL LF DISP TIS (BLADE) ×1 IMPLANT
BLADE SURG 15 STRL SS (BLADE) ×3
CANISTER SUC SOCK COL 7IN (MISCELLANEOUS) IMPLANT
CANISTER SUCT 1200ML W/VALVE (MISCELLANEOUS) IMPLANT
CHLORAPREP W/TINT 26ML (MISCELLANEOUS) ×3 IMPLANT
CLIP APPLIE 9.375 MED OPEN (MISCELLANEOUS) IMPLANT
CLOSURE WOUND 1/2 X4 (GAUZE/BANDAGES/DRESSINGS) ×1
COVER BACK TABLE 60X90IN (DRAPES) ×3 IMPLANT
COVER MAYO STAND STRL (DRAPES) ×3 IMPLANT
COVER PROBE W GEL 5X96 (DRAPES) ×3 IMPLANT
DECANTER SPIKE VIAL GLASS SM (MISCELLANEOUS) IMPLANT
DEVICE DUBIN W/COMP PLATE 8390 (MISCELLANEOUS) ×3 IMPLANT
DRAPE LAPAROSCOPIC ABDOMINAL (DRAPES) ×3 IMPLANT
DRAPE UTILITY XL STRL (DRAPES) ×3 IMPLANT
DRSG TEGADERM 4X4.75 (GAUZE/BANDAGES/DRESSINGS) IMPLANT
ELECT COATED BLADE 2.86 ST (ELECTRODE) ×3 IMPLANT
ELECT REM PT RETURN 9FT ADLT (ELECTROSURGICAL) ×3
ELECTRODE REM PT RTRN 9FT ADLT (ELECTROSURGICAL) ×1 IMPLANT
GLOVE BIO SURGEON STRL SZ7 (GLOVE) ×6 IMPLANT
GLOVE BIOGEL PI IND STRL 7.0 (GLOVE) IMPLANT
GLOVE BIOGEL PI IND STRL 7.5 (GLOVE) ×1 IMPLANT
GLOVE BIOGEL PI INDICATOR 7.0 (GLOVE) ×2
GLOVE BIOGEL PI INDICATOR 7.5 (GLOVE) ×2
GLOVE SURG SS PI 6.5 STRL IVOR (GLOVE) ×2 IMPLANT
GLOVE SURG SS PI 7.0 STRL IVOR (GLOVE) ×2 IMPLANT
GOWN STRL REUS W/ TWL LRG LVL3 (GOWN DISPOSABLE) ×2 IMPLANT
GOWN STRL REUS W/TWL LRG LVL3 (GOWN DISPOSABLE) ×6
ILLUMINATOR WAVEGUIDE N/F (MISCELLANEOUS) IMPLANT
KIT MARKER MARGIN INK (KITS) ×3 IMPLANT
LIGHT WAVEGUIDE WIDE FLAT (MISCELLANEOUS) IMPLANT
LIQUID BAND (GAUZE/BANDAGES/DRESSINGS) ×3 IMPLANT
NDL HYPO 25X1 1.5 SAFETY (NEEDLE) ×1 IMPLANT
NEEDLE HYPO 25X1 1.5 SAFETY (NEEDLE) ×3 IMPLANT
NS IRRIG 1000ML POUR BTL (IV SOLUTION) IMPLANT
PACK BASIN DAY SURGERY FS (CUSTOM PROCEDURE TRAY) ×3 IMPLANT
PENCIL BUTTON HOLSTER BLD 10FT (ELECTRODE) ×3 IMPLANT
SHEET MEDIUM DRAPE 40X70 STRL (DRAPES) IMPLANT
SLEEVE SCD COMPRESS KNEE MED (MISCELLANEOUS) ×3 IMPLANT
SPONGE GAUZE 4X4 12PLY STER LF (GAUZE/BANDAGES/DRESSINGS) IMPLANT
SPONGE LAP 4X18 X RAY DECT (DISPOSABLE) ×3 IMPLANT
STRIP CLOSURE SKIN 1/2X4 (GAUZE/BANDAGES/DRESSINGS) ×2 IMPLANT
SUT MNCRL AB 4-0 PS2 18 (SUTURE) IMPLANT
SUT MON AB 5-0 PS2 18 (SUTURE) IMPLANT
SUT SILK 2 0 SH (SUTURE) IMPLANT
SUT VIC AB 2-0 SH 27 (SUTURE) ×3
SUT VIC AB 2-0 SH 27XBRD (SUTURE) ×1 IMPLANT
SUT VIC AB 3-0 SH 27 (SUTURE) ×3
SUT VIC AB 3-0 SH 27X BRD (SUTURE) ×1 IMPLANT
SUT VIC AB 5-0 PS2 18 (SUTURE) ×2 IMPLANT
SYR CONTROL 10ML LL (SYRINGE) ×3 IMPLANT
TOWEL OR 17X24 6PK STRL BLUE (TOWEL DISPOSABLE) ×3 IMPLANT
TOWEL OR NON WOVEN STRL DISP B (DISPOSABLE) ×3 IMPLANT
TUBE CONNECTING 20'X1/4 (TUBING)
TUBE CONNECTING 20X1/4 (TUBING) IMPLANT
YANKAUER SUCT BULB TIP NO VENT (SUCTIONS) IMPLANT

## 2015-07-17 NOTE — Anesthesia Procedure Notes (Signed)
Procedure Name: LMA Insertion Date/Time: 07/17/2015 10:29 AM Performed by: Lyndee Leo Pre-anesthesia Checklist: Patient identified, Emergency Drugs available, Suction available and Patient being monitored Patient Re-evaluated:Patient Re-evaluated prior to inductionOxygen Delivery Method: Circle System Utilized Preoxygenation: Pre-oxygenation with 100% oxygen Intubation Type: IV induction Ventilation: Mask ventilation without difficulty LMA: LMA inserted LMA Size: 4.0 Number of attempts: 1 Airway Equipment and Method: Bite block Placement Confirmation: positive ETCO2 Tube secured with: Tape Dental Injury: Teeth and Oropharynx as per pre-operative assessment

## 2015-07-17 NOTE — Transfer of Care (Signed)
Immediate Anesthesia Transfer of Care Note  Patient: Shelby [name]  Procedure(s) Performed: Procedure(s): RIGHT BREAST RADIOACTIVE SEED GUIDED EXCISIONAL  BIOPSY (Right)  Patient Location: PACU  Anesthesia Type:General  Level of Consciousness: awake, sedated and patient cooperative  Airway & Oxygen Therapy: Patient Spontanous Breathing and Patient connected to face mask oxygen  Post-op Assessment: Report given to RN and Post -op Vital signs reviewed and stable  Post vital signs: Reviewed and stable  Last Vitals:  Filed Vitals:   07/17/15 0924  BP: 128/79  Pulse: 73  Temp: 36.9 C  Resp: 16    Last Pain: There were no vitals filed for this visit.       Complications: No apparent anesthesia complications

## 2015-07-17 NOTE — H&P (Signed)
45 yof who I know from prior excision of left sided papilloma and lap chole who presents after undergoing screening mm that shows density b breasts. she noted some discharge after mm but otherwise has none. her mm shows benign calcs in far posterior right breast. [place] of subareolar area shows intraductal mass that measures 9x5x8 mm in size. nodes are normal but mildly prominent. this area underwent core biopsy and is intraductal papilloma. she is here today to discuss options.  Other Problems Illene Regulus, CMA; 06/29/2015 9:28 AM) Anxiety Disorder Back Pain Depression Gastroesophageal Reflux Disease Hemorrhoids  Past Surgical History Lars Mage Spillers, CMA; 06/29/2015 9:28 AM) Breast Biopsy Bilateral. Gallbladder Surgery - Laparoscopic  Diagnostic Studies History Lars Mage Spillers, CMA; 06/29/2015 9:28 AM) Colonoscopy never Mammogram within last year Pap Smear 1-5 years ago  Allergies Illene Regulus, CMA; 06/29/2015 9:30 AM) Latex Exam Gloves *MEDICAL DEVICES AND SUPPLIES*  Medication History (Alisha Spillers, CMA; 06/29/2015 9:30 AM) No Current Medications Medications Reconciled  Social History Illene Regulus, CMA; 06/29/2015 9:28 AM) Alcohol use Occasional alcohol use. Caffeine use Carbonated beverages, Tea. Illicit drug use Prefer to discuss with provider. Tobacco use Former smoker.  Family History Illene Regulus, [place]; 06/29/2015 9:28 AM) Breast Cancer Family Members In General. Heart Disease Father. Heart disease in female family member before age 73 Migraine Headache Sister.  Pregnancy / Birth History Illene Regulus, CMA; 06/29/2015 9:28 AM) Age at menarche 83 years. Contraceptive History Oral contraceptives. Gravida 7 Maternal age 77-20 Para 3 Regular periods  Review of Systems Lars Mage Spillers CMA; 06/29/2015 9:28 AM) General Present- Weight Gain. Not Present- Appetite Loss, Chills, Fatigue, Fever, Night Sweats and Weight Loss. Skin Not  Present- Change in Wart/Mole, Dryness, Hives, Jaundice, New Lesions, Non-Healing Wounds, Rash and Ulcer. HEENT Present- Seasonal Allergies. Not Present- Earache, Hearing Loss, Hoarseness, Nose Bleed, Oral Ulcers, Ringing in the Ears, Sinus Pain, Sore Throat, Visual Disturbances, Wears glasses/contact lenses and Yellow Eyes. Breast Present- Nipple Discharge. Not Present- Breast Mass, Breast Pain and Skin Changes. Cardiovascular Present- Swelling of Extremities. Not Present- Chest Pain, Difficulty Breathing Lying Down, Leg Cramps, Palpitations, Rapid Heart Rate and Shortness of Breath. Gastrointestinal Not Present- Abdominal Pain, Bloating, Bloody Stool, Change in Bowel Habits, Chronic diarrhea, Constipation, Difficulty Swallowing, Excessive gas, Gets full quickly at meals, Hemorrhoids, Indigestion, Nausea, Rectal Pain and Vomiting. Female Genitourinary Not Present- Frequency, Nocturia, Painful Urination, Pelvic Pain and Urgency. Musculoskeletal Present- Joint Stiffness. Not Present- Back Pain, Joint Pain, Muscle Pain, Muscle Weakness and Swelling of Extremities. Neurological Not Present- Decreased Memory, Fainting, Headaches, Numbness, Seizures, Tingling, Tremor, Trouble walking and Weakness. Psychiatric Present- Anxiety. Not Present- Bipolar, Change in Sleep Pattern, Depression, Fearful and Frequent crying. Endocrine Not Present- Cold Intolerance, Excessive Hunger, Hair Changes, Heat Intolerance, Hot flashes and New Diabetes. Hematology Not Present- Easy Bruising, Excessive bleeding, Gland problems, HIV and Persistent Infections.  Vitals (Alisha Spillers CMA; 06/29/2015 9:29 AM) 06/29/2015 9:29 AM Weight: 190 lb Height: 64in Body Surface Area: 1.91 m Body Mass Index: 32.61 kg/m  Pulse: 80 (Regular)  BP: 110/68 (Sitting, Left Arm, Standard) Physical Exam Rolm Bookbinder MD; 06/29/2015 1:10 PM) General Mental Status-Alert. Orientation-Oriented X3. Chest and Lung Exam Chest and lung  exam reveals -on auscultation, normal breath sounds, no adventitious sounds and normal vocal resonance. Breast Nipples-No Discharge. Breast Lump-No Palpable Breast Mass. Cardiovascular Cardiovascular examination reveals -normal heart sounds, regular rate and rhythm with no murmurs. Lymphatic Head & Neck General Head & Neck Lymphatics: Bilateral - Description - Normal. Axillary General Axillary Region: Bilateral - Description -  Normal. Note: no [place] adenopathy   Assessment & Plan Rolm Bookbinder MD; 06/29/2015 1:12 PM) INTRADUCTAL PAPILLOMA OF BREAST, RIGHT (D24.1) Story: discussed options of observation with close interval follow up mm vs surgery. she has elected to have excision. we discussed right breast seed guided excisional biopsy given discharge would be safest option. risks/procedure all discussed, will schedule

## 2015-07-17 NOTE — Interval H&P Note (Signed)
History and Physical Interval Note:  07/17/2015 10:05 AM  Shelby [name]  has presented today for surgery, with the diagnosis of right breast mass  The various methods of treatment have been discussed with the patient and family. After consideration of risks, benefits and other options for treatment, the patient has consented to  Procedure(s): RIGHT BREAST RADIOACTIVE SEED GUIDED EXCISIONAL  BIOPSY (Right) as a surgical intervention .  The patient's history has been reviewed, patient examined, no change in status, stable for surgery.  I have reviewed the patient's chart and labs.  Questions were answered to the patient's satisfaction.     [name]

## 2015-07-17 NOTE — Op Note (Signed)
Preoperative diagnoses:right nipple discharge with mass core c/w papilloma Postoperative diagnosis: Same as above Procedure: Right breast seed guided excisional biopsy Surgeon: Dr. Serita Grammes Anesthesia: Gen. Estimated blood loss: Minimal Complications: None Drains: None Specimens:right breast tissue marked with paint Sponge and needle count correct at completion Disposition to recovery stable  Indications: This is a 79 yof I know from prior left breast biopsy. She has right nipple discharge and underwent evaluation with mass found. This underwent core biopsy and was papilloma. We discussed options and elected to proceed with seed guided excisional biopsy.  Procedure: She first had a radioactive seed placed at the breast Center. After informed consent was obtained she was then taken to the operating room. She was given cefazolin. Sequential compression devices on her legs. She was placed under general anesthesia without complication. Her left breast was then prepped and draped in the standard sterile surgical fashion. A surgical timeout was then performed. I had the mammograms in the or.   . I located the radioactive seed with the neoprobe. i was able to identify the discharging duct and there was blood present. I cannulated this with a lacrimal duct probe. I infiltrated marcaine in the periareolar area and then made a periareolar incision.  I then used the neoprobe to guide the excision of the see any surrounding tissue. This also included the discharging ductal system identified with the lacrimal duct probe. This was confirmed by the neoprobe. This was painted. This was then taken for mammogram which confirmed removal of the clip and the seed. This was confirmed by radiology. This was then sent to pathology. Hemostasis was observed. I closed the breast tissue with a 2-0 Vicryl. The dermis was closed with 3-0 Vicryl the skin with 5-0 Monocryl. Dermabond and Steri-Strips are placed. Marcaine was  infiltrated throughout this wound.

## 2015-07-17 NOTE — Anesthesia Preprocedure Evaluation (Signed)
Anesthesia Evaluation  Patient identified by MRN, date of birth, ID band Patient awake    Reviewed: Allergy & Precautions, NPO status , Patient's Chart, lab work & pertinent test results  History of Anesthesia Complications Negative for: history of anesthetic complications  Airway Mallampati: II  TM Distance: >3 FB Neck ROM: Full    Dental  (+) Teeth Intact   Pulmonary neg shortness of breath, neg sleep apnea, neg COPD, neg recent URI, former smoker,    breath sounds clear to auscultation       Cardiovascular negative cardio ROS   Rhythm:Regular     Neuro/Psych PSYCHIATRIC DISORDERS Anxiety Depression  Neuromuscular disease    GI/Hepatic negative GI ROS, Neg liver ROS,   Endo/Other  Morbid obesity  Renal/GU      Musculoskeletal   Abdominal   Peds  Hematology  (+) anemia ,   Anesthesia Other Findings   Reproductive/Obstetrics                             Anesthesia Physical Anesthesia Plan  ASA: II  Anesthesia Plan: General   Post-op Pain Management:    Induction: Intravenous  Airway Management Planned: LMA  Additional Equipment: None  Intra-op Plan:   Post-operative Plan: Extubation in OR  Informed Consent: I have reviewed the patients History and Physical, chart, labs and discussed the procedure including the risks, benefits and alternatives for the proposed anesthesia with the patient or authorized representative who has indicated his/her understanding and acceptance.   Dental advisory given  Plan Discussed with: CRNA and Surgeon  Anesthesia Plan Comments:         Anesthesia Quick Evaluation

## 2015-07-17 NOTE — Anesthesia Postprocedure Evaluation (Signed)
Anesthesia Post Note  Patient: Shelby [name]  Procedure(s) Performed: Procedure(s) (LRB): RIGHT BREAST RADIOACTIVE SEED GUIDED EXCISIONAL  BIOPSY (Right)  Patient location during evaluation: PACU Anesthesia Type: General Level of consciousness: awake and alert Pain management: pain level controlled Vital Signs Assessment: post-procedure vital signs reviewed and stable Respiratory status: spontaneous breathing, nonlabored ventilation and respiratory function stable Cardiovascular status: blood pressure returned to baseline and stable Postop Assessment: no signs of nausea or vomiting Anesthetic complications: no    Last Vitals:  Filed Vitals:   07/17/15 1130 07/17/15 1145  BP: 106/91 115/74  Pulse: 83 77  Temp:    Resp: 14 16    Last Pain:  Filed Vitals:   07/17/15 1153  PainSc: 3                  Janace Decker,W. EDMOND

## 2015-07-17 NOTE — Discharge Instructions (Signed)
[facility] °Office Phone Number [phone] ° °POST OP INSTRUCTIONS ° °Always review your discharge instruction sheet given to you by the facility where your surgery was performed. ° °IF YOU HAVE DISABILITY OR FAMILY LEAVE FORMS, YOU MUST BRING THEM TO THE OFFICE FOR PROCESSING.  DO NOT GIVE THEM TO YOUR DOCTOR. ° °1. A prescription for pain medication may be given to you upon discharge.  Take your pain medication as prescribed, if needed.  If narcotic pain medicine is not needed, then you may take acetaminophen (Tylenol), naprosyn (Alleve) or ibuprofen (Advil) as needed. °2. Take your usually prescribed medications unless otherwise directed °3. If you need a refill on your pain medication, please contact your pharmacy.  They will contact our office to request authorization.  Prescriptions will not be filled after 5pm or on week-ends. °4. You should eat very light the first 24 hours after surgery, such as soup, crackers, pudding, etc.  Resume your normal diet the day after surgery. °5. Most patients will experience some swelling and bruising in the breast.  Ice packs and a good support bra will help.  Wear the breast binder provided or a sports bra for 72 hours day and night.  After that wear a sports bra during the day until you return to the office. Swelling and bruising can take several days to resolve.  °6. It is common to experience some constipation if taking pain medication after surgery.  Increasing fluid intake and taking a stool softener will usually help or prevent this problem from occurring.  A mild laxative (Milk of Magnesia or Miralax) should be taken according to package directions if there are no bowel movements after 48 hours. °7. Unless discharge instructions indicate otherwise, you may remove your bandages 48 hours after surgery and you may shower at that time.  You may have steri-strips (small skin tapes) in place directly over the incision.  These strips should be left on the  skin for 7-10 days and will come off on their own.  If your surgeon used skin glue on the incision, you may shower in 24 hours.  The glue will flake off over the next 2-3 weeks.  Any sutures or staples will be removed at the office during your follow-up visit. °8. ACTIVITIES:  You may resume regular daily activities (gradually increasing) beginning the next day.  Wearing a good support bra or sports bra minimizes pain and swelling.  You may have sexual intercourse when it is comfortable. °a. You may drive when you no longer are taking prescription pain medication, you can comfortably wear a seatbelt, and you can safely maneuver your car and apply brakes. °b. RETURN TO WORK:  ______________________________________________________________________________________ °9. You should see your doctor in the office for a follow-up appointment approximately two weeks after your surgery.  Your doctor’s nurse will typically make your follow-up appointment when she calls you with your pathology report.  Expect your pathology report 3-4 business days after your surgery.  You may call to check if you do not hear from us after three days. °10. OTHER INSTRUCTIONS: _______________________________________________________________________________________________ _____________________________________________________________________________________________________________________________________ °_____________________________________________________________________________________________________________________________________ °_____________________________________________________________________________________________________________________________________ ° °WHEN TO CALL DR WAKEFIELD: °1. Fever over 101.0 °2. Nausea and/or vomiting. °3. Extreme swelling or bruising. °4. Continued bleeding from incision. °5. Increased pain, redness, or drainage from the incision. ° °The clinic staff is available to answer your questions during regular  business hours.  Please don’t hesitate to call and ask to speak to one of the nurses for clinical concerns.  If   you have a medical emergency, go to the nearest emergency room or call 911.  A surgeon from [facility] is always on call at the hospital. ° °For further questions, please visit [email or web address] ° ° ° °Post Anesthesia Home Care Instructions ° °Activity: °Get plenty of rest for the remainder of the day. A responsible adult should stay with you for 24 hours following the procedure.  °For the next 24 hours, DO NOT: °-Drive a car °-Operate machinery °-Drink alcoholic beverages °-Take any medication unless instructed by your physician °-Make any legal decisions or sign important papers. ° °Meals: °Start with liquid foods such as gelatin or soup. Progress to regular foods as tolerated. Avoid greasy, spicy, heavy foods. If nausea and/or vomiting occur, drink only clear liquids until the nausea and/or vomiting subsides. Call your physician if vomiting continues. ° °Special Instructions/Symptoms: °Your throat may feel dry or sore from the anesthesia or the breathing tube placed in your throat during surgery. If this causes discomfort, gargle with warm salt water. The discomfort should disappear within 24 hours. ° °If you had a scopolamine patch placed behind your ear for the management of post- operative nausea and/or vomiting: ° °1. The medication in the patch is effective for 72 hours, after which it should be removed.  Wrap patch in a tissue and discard in the trash. Wash hands thoroughly with soap and water. °2. You may remove the patch earlier than 72 hours if you experience unpleasant side effects which may include dry mouth, dizziness or visual disturbances. °3. Avoid touching the patch. Wash your hands with soap and water after contact with the patch. °  ° °

## 2015-07-18 ENCOUNTER — Encounter (HOSPITAL_BASED_OUTPATIENT_CLINIC_OR_DEPARTMENT_OTHER): Payer: Self-pay | Admitting: General Surgery

## 2015-09-24 IMAGING — US US ABDOMEN COMPLETE
1 series · 13 of 25 positions shown · non-contrast
Comparison: None.

CLINICAL DATA: Epigastric abdominal pain

EXAM:
ULTRASOUND ABDOMEN COMPLETE

[Series 1: us abdomen complete · 0.30mm/px · 13 of 79 slices shown]
[im 1/79]
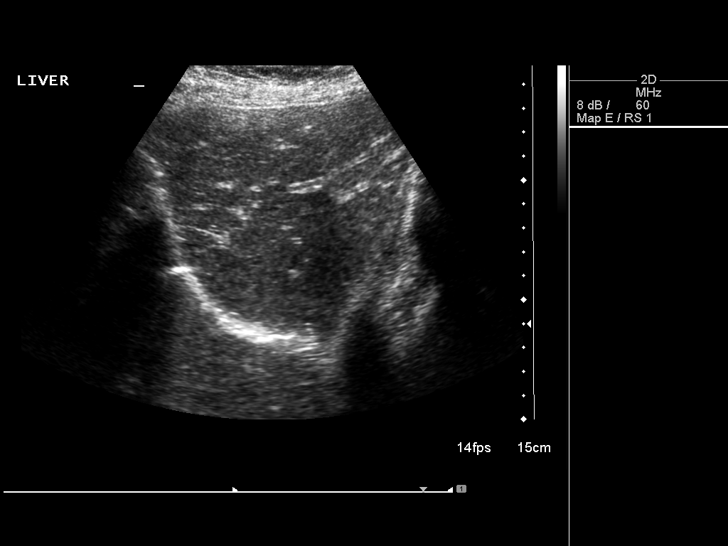
[im 7/79]
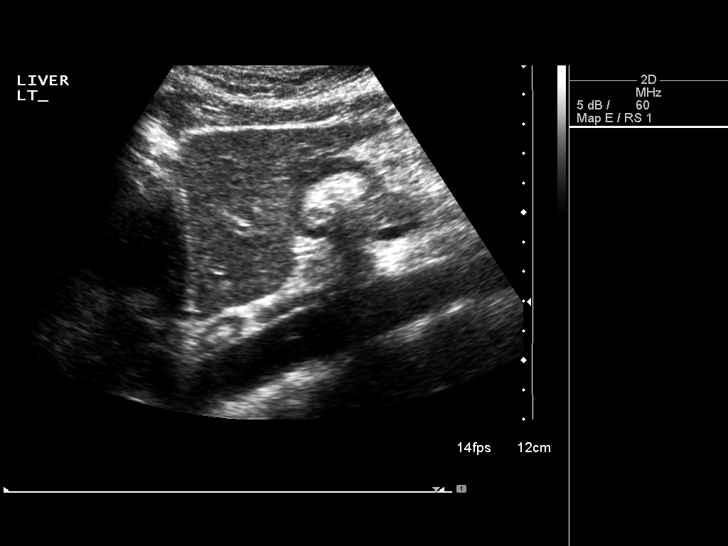
[im 14/79]
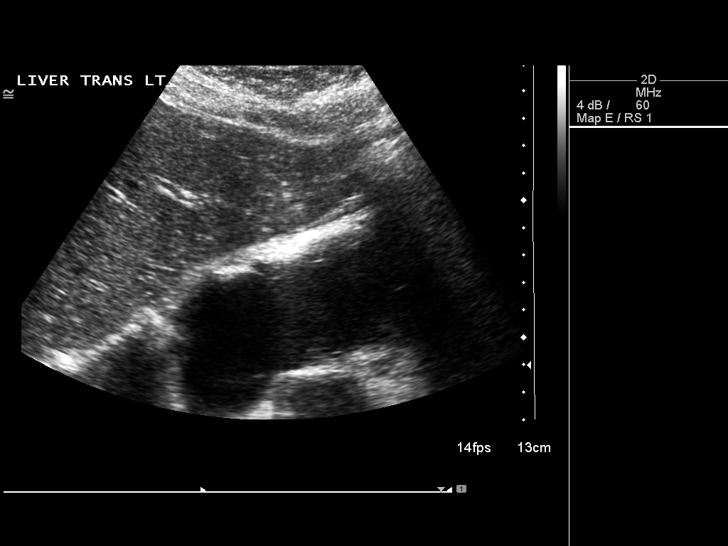
[im 20/79]
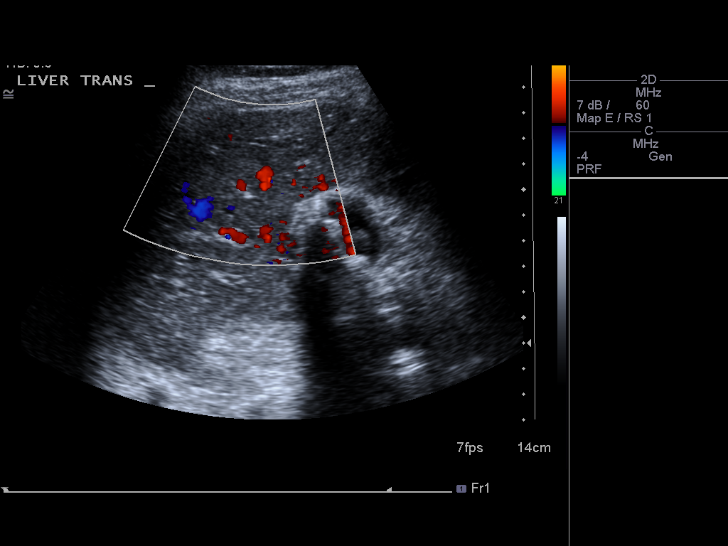
[im 27/79]
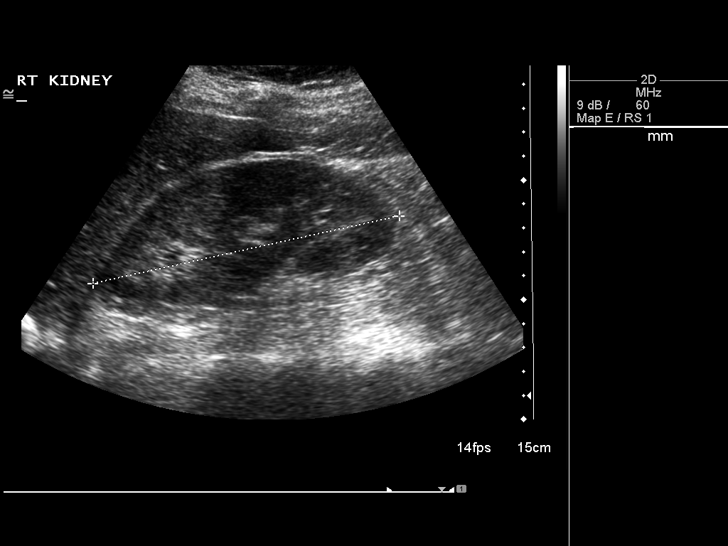
[im 33/79]
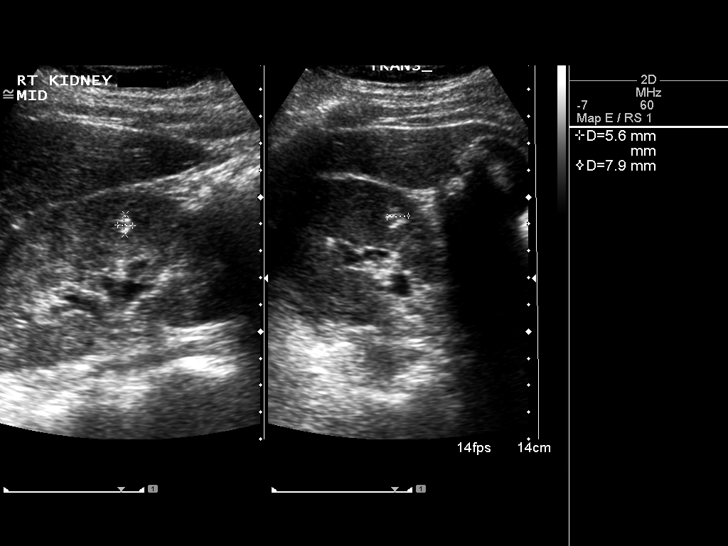
[im 40/79]
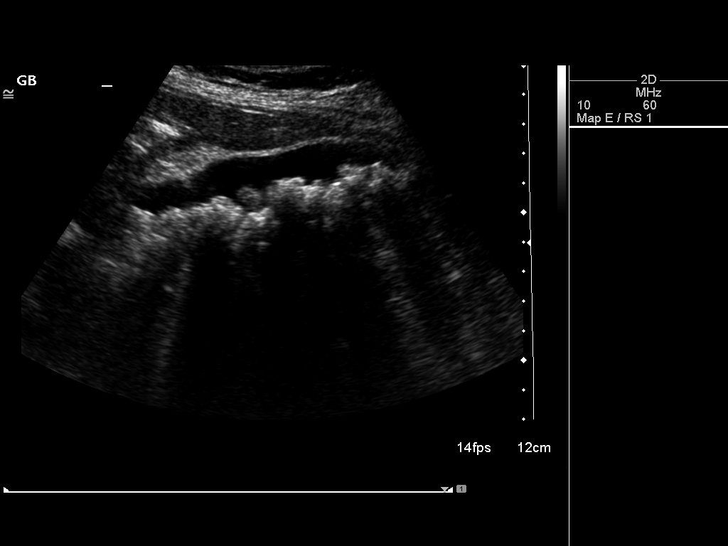
[im 46/79]
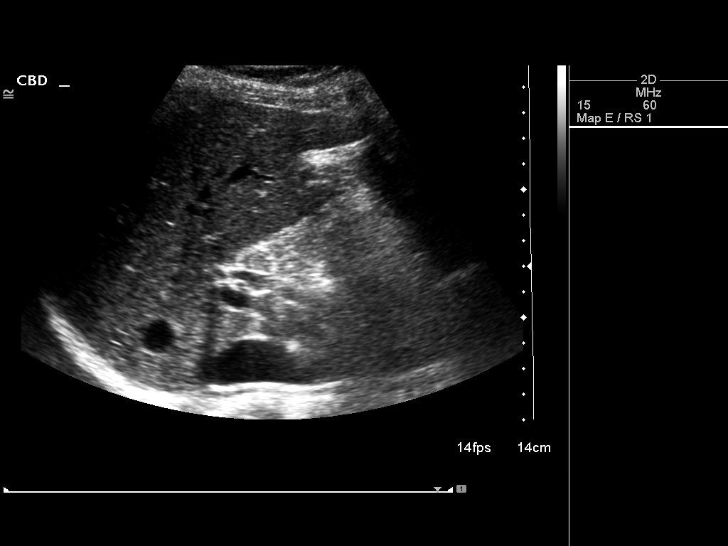
[im 53/79]
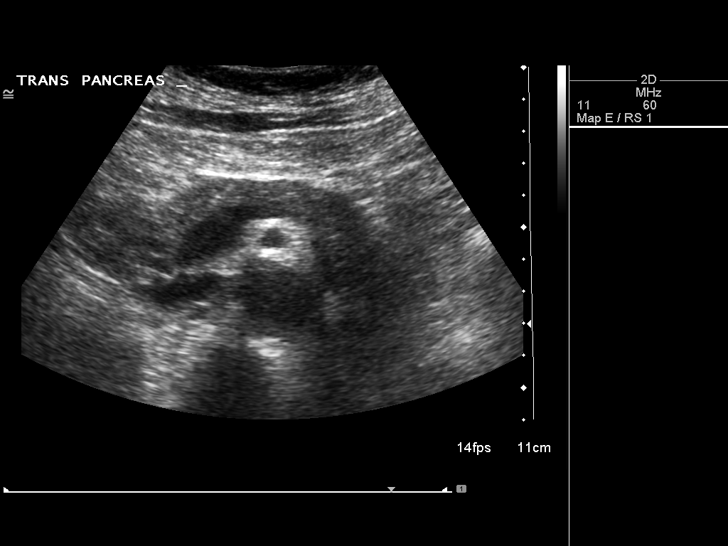
[im 59/79]
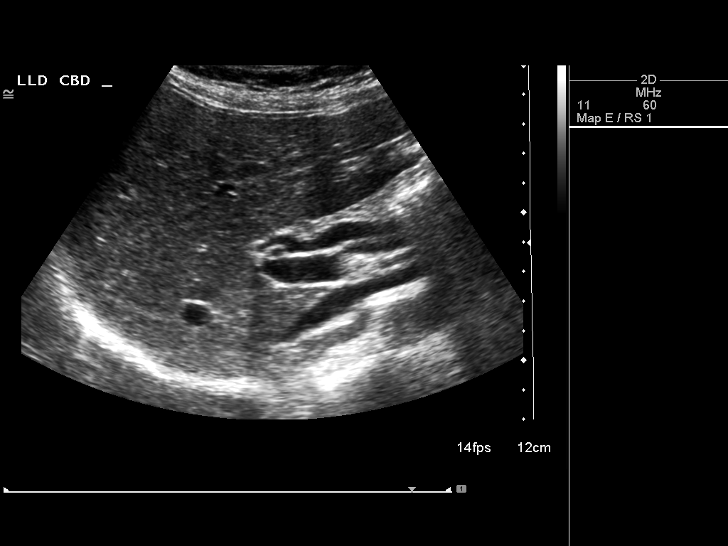
[im 66/79]
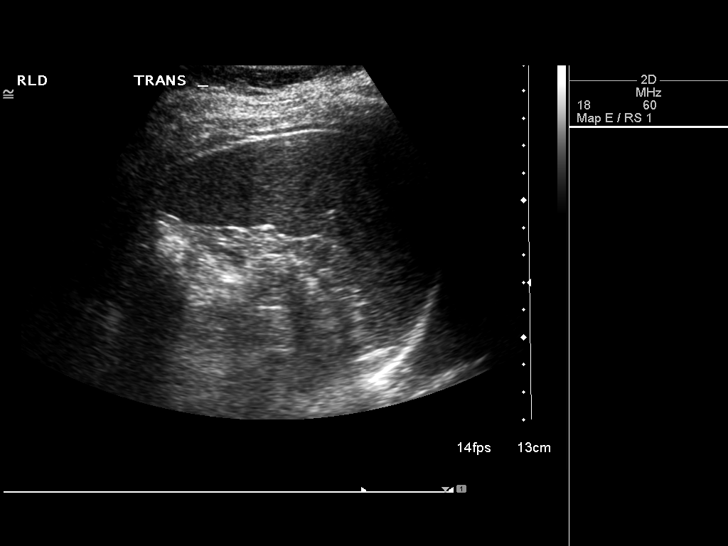
[im 72/79]
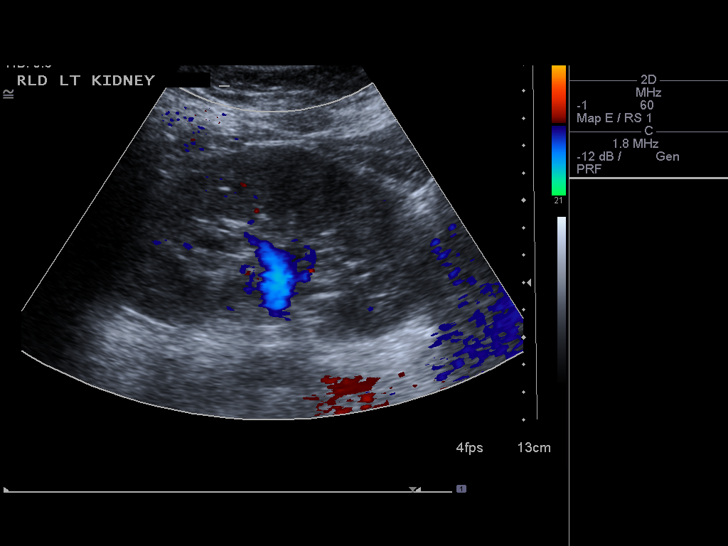
[im 79/79]
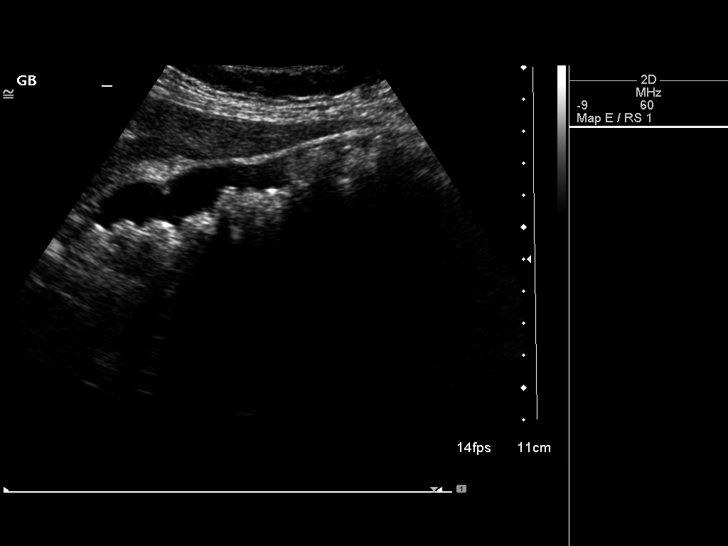

[13 of 25 positions shown; findings below may reference images not displayed]

FINDINGS: Gallbladder:

Multiple echogenic shadowing gallstones noted within the
gallbladder. These layer dependently. Normal wall thickness
measuring 2.2 mm. No Murphy's sign. No pericholecystic fluid.

Common bile duct:

Diameter: 4.5 mm

Liver:

Normal echogenicity. No focal abnormality. Slight prominence of the
intrahepatic biliary ducts diffusely. This appears nonspecific.

IVC:

No abnormality visualized.

Pancreas:

Visualized portion unremarkable.

Spleen:

New

Right Kidney:

Length: 13.4 cm. No hydronephrosis. Small hypoechoic cortical cysts
noted, largest in the upper pole measures 10 mm. Right kidney lower
pole 8 mm echogenic non shadowing focus within the cortex noted,
suspect small angiomyolipoma.

Left Kidney:

Length: 12.8 cm. Echogenicity within normal limits. No mass or
hydronephrosis visualized.

Abdominal aorta:

No aneurysm visualized.

Other findings:

None.
IMPRESSION: Cholelithiasis.

No ultrasound evidence of acute cholecystitis or biliary dilatation.

small right cortical renal cysts

8 mm right renal lower pole non shadowing echogenic focus within the
cortex, suspect small angiomyolipoma.

## 2017-01-23 ENCOUNTER — Other Ambulatory Visit: Payer: Self-pay | Admitting: Obstetrics and Gynecology

## 2017-01-23 DIAGNOSIS — R1031 Right lower quadrant pain: Secondary | ICD-10-CM

## 2017-02-11 ENCOUNTER — Ambulatory Visit
Admission: RE | Admit: 2017-02-11 | Discharge: 2017-02-11 | Disposition: A | Discharge status: Home / Self Care | Payer: BLUE CROSS/BLUE SHIELD | Source: Ambulatory Visit | Attending: Obstetrics and Gynecology | Admitting: Obstetrics and Gynecology

## 2017-02-11 DIAGNOSIS — R1031 Right lower quadrant pain: Secondary | ICD-10-CM

## 2017-05-29 ENCOUNTER — Ambulatory Visit (HOSPITAL_COMMUNITY)
Admission: EM | Admit: 2017-05-29 | Discharge: 2017-05-29 | Disposition: A | Discharge status: Home / Self Care | Payer: BLUE CROSS/BLUE SHIELD | Attending: Nurse Practitioner | Admitting: Nurse Practitioner

## 2017-05-29 ENCOUNTER — Encounter (HOSPITAL_COMMUNITY): Payer: Self-pay | Admitting: Emergency Medicine

## 2017-05-29 DIAGNOSIS — R21 Rash and other nonspecific skin eruption: Secondary | ICD-10-CM | POA: Diagnosis not present

## 2017-05-29 MED ORDER — FAMOTIDINE 20 MG PO TABS: 20.0000 mg | ORAL_TABLET | Freq: Two times a day (BID) | ORAL | 0 refills | Status: DC

## 2017-05-29 MED ORDER — METHYLPREDNISOLONE SODIUM SUCC 125 MG IJ SOLR
INTRAMUSCULAR | Status: AC
  Filled 2017-05-29: qty 2

## 2017-05-29 MED ORDER — PREDNISONE 10 MG PO TABS: 20.0000 mg | ORAL_TABLET | Freq: Every day | ORAL | 0 refills | Status: AC

## 2017-05-29 MED ORDER — METHYLPREDNISOLONE SODIUM SUCC 125 MG IJ SOLR
80.0000 mg | Freq: Once | INTRAMUSCULAR | Status: AC
  Administered 2017-05-29: 80 mg via INTRAMUSCULAR

## 2017-05-29 NOTE — ED Provider Notes (Signed)
[facility]    CSN: [id number] Arrival date & time: 05/29/17  1000     History   Chief Complaint Chief Complaint  Patient presents with  . Rash    HPI Shelby [name] is a 47 y.o. female.   Subjective:   Shelby [name] is a 47 y.o. female who presents for evaluation of rash. Rash started 1 day ago. Initial distribution: left shoulder area extending down to the left upper chest. She also has lesions to the left wrist area and left forearm. Lesions are pink in color, are of raised texture.  Rash has improved over time without any interventions. Rash is pruritic. Associated symptoms: none. Patient denies: Mouth/tongue swelling, shortness of breath, abdominal pain, nausea or vomiting.. Patient has not had previous evaluation of rash. Patient has not had previous treatment.  Patient has not had contacts with similar rash. Patient has not had new exposures.  The following portions of the patient's history were reviewed and updated as appropriate: allergies, current medications, past family history, past medical history, past social history, past surgical history and problem list.       Past Medical History:  Diagnosis Date  . Anemia   . Anxiety   . Depression   . Neuromuscular disorder (HCC)    muscle spasms  . Sickle cell trait ([place])   . Sickle cell trait ([place]) 1992  . UTI (lower urinary tract infection)     Patient Active Problem List   Diagnosis Date Noted  . S/P laparoscopic cholecystectomy 07/13/2013    Past Surgical History:  Procedure Laterality Date  . BREAST SURGERY     right breast seed guided biopsy  . CESAREAN SECTION    . CHOLECYSTECTOMY N/A 07/13/2013   Procedure: LAPAROSCOPIC CHOLECYSTECTOMY;  Surgeon: Rolm Bookbinder, MD;  Location: [place];  Service: General;  Laterality: N/A;  . [place]   Miscarriage  . RADIOACTIVE SEED GUIDED EXCISIONAL BREAST BIOPSY Right 07/17/2015   Procedure: RIGHT  BREAST RADIOACTIVE SEED GUIDED EXCISIONAL  BIOPSY;  Surgeon: Rolm Bookbinder, MD;  Location: [place];  Service: General;  Laterality: Right;    OB History   None      Home Medications    Prior to Admission medications   Medication Sig Start Date End Date Taking? Authorizing Provider  Cholecalciferol (VITAMIN D3) 3000 units TABS Take 800 mg by mouth. Has not started yet    [provider]    Family History No family history on file.  Social History Social History   Tobacco Use  . Smoking status: Former Smoker    Last attempt to quit: 06/26/2011    Years since quitting: 5.9  Substance Use Topics  . Alcohol use: Yes    Comment: rarely  . Drug use: No     Allergies   Latex   Review of Systems Review of Systems  HENT: Negative for trouble swallowing.   Respiratory: Negative for shortness of breath.   Gastrointestinal: Negative for abdominal pain, nausea and vomiting.  Skin: Positive for rash.  All other systems reviewed and are negative.    Physical Exam Triage Vital Signs ED Triage Vitals  Enc Vitals Group     BP 05/29/17 1013 119/82     Pulse Rate 05/29/17 1013 80     Resp 05/29/17 1013 18     Temp 05/29/17 1013 98 F (36.7 C)     Temp src --  SpO2 05/29/17 1013 100 %     Weight --      Height --      Head Circumference --      Peak Flow --      Pain Score 05/29/17 1014 0     Pain Loc --      Pain Edu? --      Excl. in [place]? --    No data found.  Updated Vital Signs BP 119/82   Pulse 80   Temp 98 F (36.7 C)   Resp 18   SpO2 100%   Visual Acuity Right Eye Distance:   Left Eye Distance:   Bilateral Distance:    Right Eye Near:   Left Eye Near:    Bilateral Near:     Physical Exam  Constitutional: She is oriented to person, place, and time. She appears well-developed and well-nourished.  HENT:  Head: Normocephalic.  Neck: Normal range of motion. Neck supple.  Cardiovascular: Normal rate and regular  rhythm.  Pulmonary/Chest: Effort normal and breath sounds normal.  Musculoskeletal: Normal range of motion.  Neurological: She is alert and oriented to person, place, and time.  Skin: Skin is warm and dry.  Diffuse skin rash noted to the left lateral neck area radiating to the left upper chest. There is also a small patchy rash to the palmar aspect of the left wrist and left forearm. Rashes are raised. No open areas or drainage. No signs of cellulitis.     UC Treatments / Results  Labs (all labs ordered are listed, but only abnormal results are displayed) Labs Reviewed - No data to display  EKG None Radiology No results found.  Procedures Procedures (including critical care time)  Medications Ordered in UC Medications - No data to display   Initial Impression / Assessment and Plan / UC Course  I have reviewed the triage vital signs and the nursing notes.  Pertinent labs & imaging results that were available during my care of the patient were reviewed by me and considered in my medical decision making (see chart for details).      47 year old female presenting with diffuse skin rash 1 day which has improved significantly since onset. No mouth/tongue swelling, shortness breath, abdominal pain, nausea or vomiting. Treat with prednisone, Benadryl and Pepcid 5 days. Watch for signs of fever or worsening of the rash. Follow up with dermatology and/or allergist if unimproved. Discussed diagnosis and treatment with patient. All questions have been answered and all concerns have been addressed. The patient verbalized understanding and had no further questions    Final Clinical Impressions(s) / UC Diagnoses   Final diagnoses:  Rash and nonspecific skin eruption    ED Discharge Orders    None       Controlled Substance Prescriptions [place] Controlled Substance Registry consulted? Not Applicable   Enrique Sack, [place] 05/29/17 1105

## 2017-05-29 NOTE — ED Triage Notes (Signed)
Pt c/o rash on the L side of her chest and L wrist. C/o itchiness.

## 2018-12-25 ENCOUNTER — Other Ambulatory Visit: Payer: Self-pay | Admitting: Obstetrics and Gynecology

## 2018-12-25 DIAGNOSIS — N6452 Nipple discharge: Secondary | ICD-10-CM

## 2019-01-05 ENCOUNTER — Ambulatory Visit
Admission: RE | Admit: 2019-01-05 | Discharge: 2019-01-05 | Disposition: A | Discharge status: Home / Self Care | Payer: BC Managed Care – PPO | Source: Ambulatory Visit | Attending: Obstetrics and Gynecology | Admitting: Obstetrics and Gynecology

## 2019-01-05 ENCOUNTER — Other Ambulatory Visit: Payer: Self-pay

## 2019-01-05 ENCOUNTER — Other Ambulatory Visit: Payer: BLUE CROSS/BLUE SHIELD

## 2019-01-05 ENCOUNTER — Ambulatory Visit
Admission: RE | Admit: 2019-01-05 | Discharge: 2019-01-05 | Disposition: A | Discharge status: Home / Self Care | Payer: BLUE CROSS/BLUE SHIELD | Source: Ambulatory Visit | Attending: Obstetrics and Gynecology | Admitting: Obstetrics and Gynecology

## 2019-01-05 DIAGNOSIS — N6452 Nipple discharge: Secondary | ICD-10-CM

## 2019-01-13 ENCOUNTER — Other Ambulatory Visit: Payer: Self-pay | Admitting: Obstetrics and Gynecology

## 2019-01-13 DIAGNOSIS — N6452 Nipple discharge: Secondary | ICD-10-CM

## 2019-01-15 ENCOUNTER — Other Ambulatory Visit: Payer: Self-pay | Admitting: Obstetrics and Gynecology

## 2019-01-15 DIAGNOSIS — N6452 Nipple discharge: Secondary | ICD-10-CM

## 2019-02-01 ENCOUNTER — Other Ambulatory Visit: Payer: Self-pay | Admitting: Cardiology

## 2019-02-01 DIAGNOSIS — Z20822 Contact with and (suspected) exposure to covid-19: Secondary | ICD-10-CM

## 2019-02-02 LAB — NOVEL CORONAVIRUS, NAA: SARS-CoV-2, NAA: NOT DETECTED

## 2019-02-15 ENCOUNTER — Other Ambulatory Visit: Payer: Self-pay

## 2019-02-15 DIAGNOSIS — Z20822 Contact with and (suspected) exposure to covid-19: Secondary | ICD-10-CM

## 2019-02-16 LAB — NOVEL CORONAVIRUS, NAA: SARS-CoV-2, NAA: NOT DETECTED

## 2019-05-12 DIAGNOSIS — E0590 Thyrotoxicosis, unspecified without thyrotoxic crisis or storm: Secondary | ICD-10-CM | POA: Diagnosis not present

## 2019-05-18 DIAGNOSIS — N6452 Nipple discharge: Secondary | ICD-10-CM | POA: Diagnosis not present

## 2019-05-18 DIAGNOSIS — Z6832 Body mass index (BMI) 32.0-32.9, adult: Secondary | ICD-10-CM | POA: Diagnosis not present

## 2019-05-18 DIAGNOSIS — D573 Sickle-cell trait: Secondary | ICD-10-CM | POA: Diagnosis not present

## 2019-05-18 DIAGNOSIS — E0590 Thyrotoxicosis, unspecified without thyrotoxic crisis or storm: Secondary | ICD-10-CM | POA: Diagnosis not present

## 2019-06-10 DIAGNOSIS — E042 Nontoxic multinodular goiter: Secondary | ICD-10-CM | POA: Diagnosis not present

## 2019-06-10 DIAGNOSIS — R1310 Dysphagia, unspecified: Secondary | ICD-10-CM | POA: Diagnosis not present

## 2019-06-10 DIAGNOSIS — E0590 Thyrotoxicosis, unspecified without thyrotoxic crisis or storm: Secondary | ICD-10-CM | POA: Diagnosis not present

## 2019-06-10 DIAGNOSIS — E0500 Thyrotoxicosis with diffuse goiter without thyrotoxic crisis or storm: Secondary | ICD-10-CM | POA: Diagnosis not present

## 2019-06-14 DIAGNOSIS — N6452 Nipple discharge: Secondary | ICD-10-CM | POA: Diagnosis not present

## 2019-06-14 DIAGNOSIS — E0590 Thyrotoxicosis, unspecified without thyrotoxic crisis or storm: Secondary | ICD-10-CM | POA: Diagnosis not present

## 2019-06-23 DIAGNOSIS — E0590 Thyrotoxicosis, unspecified without thyrotoxic crisis or storm: Secondary | ICD-10-CM | POA: Diagnosis not present

## 2019-06-23 DIAGNOSIS — D573 Sickle-cell trait: Secondary | ICD-10-CM | POA: Diagnosis not present

## 2019-06-23 DIAGNOSIS — N6452 Nipple discharge: Secondary | ICD-10-CM | POA: Diagnosis not present

## 2019-07-22 DIAGNOSIS — E0590 Thyrotoxicosis, unspecified without thyrotoxic crisis or storm: Secondary | ICD-10-CM | POA: Diagnosis not present

## 2019-08-03 DIAGNOSIS — Z8041 Family history of malignant neoplasm of ovary: Secondary | ICD-10-CM | POA: Diagnosis not present

## 2019-08-03 DIAGNOSIS — N6452 Nipple discharge: Secondary | ICD-10-CM | POA: Diagnosis not present

## 2019-08-05 ENCOUNTER — Other Ambulatory Visit: Payer: Self-pay

## 2019-08-05 ENCOUNTER — Ambulatory Visit
Admission: RE | Admit: 2019-08-05 | Discharge: 2019-08-05 | Disposition: A | Discharge status: Home / Self Care | Payer: BC Managed Care – PPO | Source: Ambulatory Visit | Attending: Obstetrics and Gynecology | Admitting: Obstetrics and Gynecology

## 2019-08-05 DIAGNOSIS — N6452 Nipple discharge: Secondary | ICD-10-CM

## 2019-08-05 DIAGNOSIS — N6325 Unspecified lump in the left breast, overlapping quadrants: Secondary | ICD-10-CM | POA: Diagnosis not present

## 2019-08-05 MED ORDER — GADOBUTROL 1 MMOL/ML IV SOLN
9.0000 mL | Freq: Once | INTRAVENOUS | Status: AC | PRN
  Administered 2019-08-05: 9 mL via INTRAVENOUS

## 2019-08-09 DIAGNOSIS — E0590 Thyrotoxicosis, unspecified without thyrotoxic crisis or storm: Secondary | ICD-10-CM | POA: Diagnosis not present

## 2019-08-09 DIAGNOSIS — D573 Sickle-cell trait: Secondary | ICD-10-CM | POA: Diagnosis not present

## 2019-08-09 DIAGNOSIS — N6452 Nipple discharge: Secondary | ICD-10-CM | POA: Diagnosis not present

## 2019-08-10 ENCOUNTER — Other Ambulatory Visit: Payer: Self-pay | Admitting: Obstetrics and Gynecology

## 2019-08-10 ENCOUNTER — Other Ambulatory Visit: Payer: Self-pay | Admitting: Internal Medicine

## 2019-08-10 DIAGNOSIS — R9389 Abnormal findings on diagnostic imaging of other specified body structures: Secondary | ICD-10-CM

## 2019-08-10 DIAGNOSIS — E042 Nontoxic multinodular goiter: Secondary | ICD-10-CM

## 2019-08-16 ENCOUNTER — Ambulatory Visit
Admission: RE | Admit: 2019-08-16 | Discharge: 2019-08-16 | Disposition: A | Discharge status: Home / Self Care | Payer: Self-pay | Source: Ambulatory Visit | Attending: Internal Medicine | Admitting: Internal Medicine

## 2019-08-16 ENCOUNTER — Other Ambulatory Visit: Payer: Self-pay | Admitting: Internal Medicine

## 2019-08-16 DIAGNOSIS — E041 Nontoxic single thyroid nodule: Secondary | ICD-10-CM

## 2019-08-24 ENCOUNTER — Ambulatory Visit
Admission: RE | Admit: 2019-08-24 | Discharge: 2019-08-24 | Disposition: A | Discharge status: Home / Self Care | Payer: BC Managed Care – PPO | Source: Ambulatory Visit | Attending: Obstetrics and Gynecology | Admitting: Obstetrics and Gynecology

## 2019-08-24 ENCOUNTER — Other Ambulatory Visit: Payer: Self-pay

## 2019-08-24 DIAGNOSIS — R9389 Abnormal findings on diagnostic imaging of other specified body structures: Secondary | ICD-10-CM

## 2019-08-24 DIAGNOSIS — N6459 Other signs and symptoms in breast: Secondary | ICD-10-CM | POA: Diagnosis not present

## 2019-08-24 DIAGNOSIS — R599 Enlarged lymph nodes, unspecified: Secondary | ICD-10-CM | POA: Diagnosis not present

## 2019-08-25 ENCOUNTER — Other Ambulatory Visit: Payer: Self-pay | Admitting: Obstetrics and Gynecology

## 2019-08-25 DIAGNOSIS — R9389 Abnormal findings on diagnostic imaging of other specified body structures: Secondary | ICD-10-CM

## 2019-08-31 ENCOUNTER — Other Ambulatory Visit (HOSPITAL_COMMUNITY)
Admission: RE | Admit: 2019-08-31 | Discharge: 2019-08-31 | Disposition: A | Discharge status: Home / Self Care | Payer: BC Managed Care – PPO | Source: Ambulatory Visit | Attending: Radiology | Admitting: Radiology

## 2019-08-31 ENCOUNTER — Ambulatory Visit
Admission: RE | Admit: 2019-08-31 | Discharge: 2019-08-31 | Disposition: A | Discharge status: Home / Self Care | Payer: BC Managed Care – PPO | Source: Ambulatory Visit | Attending: Internal Medicine | Admitting: Internal Medicine

## 2019-08-31 DIAGNOSIS — E041 Nontoxic single thyroid nodule: Secondary | ICD-10-CM | POA: Diagnosis not present

## 2019-08-31 DIAGNOSIS — E042 Nontoxic multinodular goiter: Secondary | ICD-10-CM | POA: Diagnosis not present

## 2019-09-03 ENCOUNTER — Ambulatory Visit
Admission: RE | Admit: 2019-09-03 | Discharge: 2019-09-03 | Disposition: A | Discharge status: Home / Self Care | Payer: BC Managed Care – PPO | Source: Ambulatory Visit | Attending: Obstetrics and Gynecology | Admitting: Obstetrics and Gynecology

## 2019-09-03 ENCOUNTER — Other Ambulatory Visit: Payer: Self-pay

## 2019-09-03 ENCOUNTER — Other Ambulatory Visit (HOSPITAL_COMMUNITY): Payer: Self-pay | Admitting: Diagnostic Radiology

## 2019-09-03 DIAGNOSIS — N6342 Unspecified lump in left breast, subareolar: Secondary | ICD-10-CM | POA: Diagnosis not present

## 2019-09-03 DIAGNOSIS — R9389 Abnormal findings on diagnostic imaging of other specified body structures: Secondary | ICD-10-CM

## 2019-09-03 DIAGNOSIS — N6022 Fibroadenosis of left breast: Secondary | ICD-10-CM | POA: Diagnosis not present

## 2019-09-03 DIAGNOSIS — N6320 Unspecified lump in the left breast, unspecified quadrant: Secondary | ICD-10-CM | POA: Diagnosis not present

## 2019-09-03 MED ORDER — GADOBUTROL 1 MMOL/ML IV SOLN
9.0000 mL | Freq: Once | INTRAVENOUS | Status: AC | PRN
  Administered 2019-09-03: 9 mL via INTRAVENOUS

## 2019-09-07 LAB — CYTOLOGY - NON PAP

## 2019-09-11 DIAGNOSIS — E042 Nontoxic multinodular goiter: Secondary | ICD-10-CM | POA: Diagnosis not present

## 2019-09-21 ENCOUNTER — Encounter (HOSPITAL_COMMUNITY): Payer: Self-pay

## 2019-12-10 ENCOUNTER — Other Ambulatory Visit: Payer: Self-pay

## 2019-12-10 ENCOUNTER — Ambulatory Visit: Payer: BC Managed Care – PPO | Admitting: Podiatry

## 2019-12-10 DIAGNOSIS — B351 Tinea unguium: Secondary | ICD-10-CM | POA: Diagnosis not present

## 2019-12-10 DIAGNOSIS — Z79899 Other long term (current) drug therapy: Secondary | ICD-10-CM

## 2019-12-10 DIAGNOSIS — Q666 Other congenital valgus deformities of feet: Secondary | ICD-10-CM | POA: Diagnosis not present

## 2019-12-14 ENCOUNTER — Encounter: Payer: Self-pay | Admitting: Podiatry

## 2019-12-14 NOTE — Progress Notes (Signed)
Subjective:  Patient ID: Shelby [name], female    DOB: 1970-08-11,  MRN: [id number]  Chief Complaint  Patient presents with  . Nail Problem    Pain in the right nail like a burning sensation and feels like a knot is on top of the right foot     49 y.o. female presents with the above complaint.  Patient presents with complaint of right hallux onychomycosis/thickening of the nails.  Patient states the nail burns.  She states that she would like to discuss treatment options.  She has not tried anything.  She wants to know if she has there is any medication for nail fungus improvement.  She also has secondary complaint of being flat-footed.  She has not seek any treatments in the past.  She states that the pains when walking on the foot especially on the top of the foot.  She denies any other acute complaints her pain scale is 7 out of 10 is dull achy in nature.   Review of Systems: Negative except as noted in the HPI. Denies N/V/F/Ch.  Past Medical History:  Diagnosis Date  . Anemia   . Anxiety   . Depression   . Neuromuscular disorder (HCC)    muscle spasms  . Sickle cell trait ([place])   . Sickle cell trait ([place]) 1992  . UTI (lower urinary tract infection)     Current Outpatient Medications:  .  diazepam (VALIUM) 5 MG tablet, Take by mouth., Disp: , Rfl:  .  Cholecalciferol (VITAMIN D3) 3000 units TABS, Take 800 mg by mouth. Has not started yet, Disp: , Rfl:  .  famotidine (PEPCID) 20 MG tablet, Take 1 tablet (20 mg total) by mouth 2 (two) times daily for 5 days., Disp: 10 tablet, Rfl: 0 .  fluconazole (DIFLUCAN) 150 MG tablet, TAKE 1 TABLET BY MOUTH BY MOUTH, Disp: , Rfl:  .  meloxicam (MOBIC) 15 MG tablet, meloxicam 15 mg tablet  TAKE 1 TABLET BY MOUTH ONCE DAILY, Disp: , Rfl:  .  methimazole (TAPAZOLE) 10 MG tablet, methimazole 10 mg tablet  TAKE 2 TABLETS BY MOUTH IN THE MORNING AND 1 AT NIGHT, Disp: , Rfl:  .  nitrofurantoin, macrocrystal-monohydrate, (MACROBID) 100  MG capsule, Take by mouth., Disp: , Rfl:  .  triamcinolone cream (KENALOG) 0.1 %, triamcinolone acetonide 0.1 % topical cream, Disp: , Rfl:   Social History   Tobacco Use  Smoking Status Former Smoker  . Quit date: 06/26/2011  . Years since quitting: 8.4    Allergies  Allergen Reactions  . Latex Rash   Objective:  There were no vitals filed for this visit. There is no height or weight on file to calculate BMI. Constitutional Well developed. Well nourished.  Vascular Dorsalis pedis pulses palpable bilaterally. Posterior tibial pulses palpable bilaterally. Capillary refill normal to all digits.  No cyanosis or clubbing noted. Pedal hair growth normal.  Neurologic Normal speech. Oriented to person, place, and time. Epicritic sensation to light touch grossly present bilaterally.  Dermatologic  thickened elongated dystrophic toenails x1 to the right hallux.  Mild pain on palpation. No open wounds. No skin lesions.  Orthopedic:  Gait examination shows calcaneal valgus with too many toe signs partially but recruit the arch with dorsiflexion of the hallux.  Heel raise return to calcaneus back to neutral position.   Radiographs: None Assessment:   1. Long-term use of high-risk medication   2. Pes planovalgus   3. Onychomycosis    Plan:  Patient  was evaluated and treated and all questions answered.  Right hallux onychomycosis -Educated the patient on the etiology of onychomycosis and various treatment options associated with improving the fungal load.  I explained to the patient that there is 3 treatment options available to treat the onychomycosis including topical, p.o., laser treatment.  Patient elected to undergo p.o. options with Lamisil/terbinafine therapy.  In order for me to start the medication therapy, I explained to the patient the importance of evaluating the liver and obtaining the liver function test.  Once the liver function test comes back normal I will start him on  66-month course of Lamisil therapy.  Patient understood all risk and would like to proceed with Lamisil therapy.  I have asked the patient to immediately stop the Lamisil therapy if she has any reactions to it and call the office or go to the emergency room right away.  Patient states understanding  Semiflexible pes planovalgus deformity -I explained patient the etiology of pes planovalgus and various treatment options were discussed.  I believe patient will benefit from custom-made orthotics to help control the hindfoot motion as well as support the arch of the foot and allow her to ambulate without any restrictions.  Patient agrees with the plan would like to proceed with custom-made orthotics -Should be scheduled to see rec for custom-made orthotics  No follow-ups on file.

## 2019-12-24 ENCOUNTER — Ambulatory Visit: Payer: BC Managed Care – PPO | Admitting: Podiatry

## 2019-12-27 ENCOUNTER — Other Ambulatory Visit: Payer: BC Managed Care – PPO | Admitting: Orthotics

## 2020-01-30 DIAGNOSIS — Z23 Encounter for immunization: Secondary | ICD-10-CM | POA: Diagnosis not present

## 2020-04-12 ENCOUNTER — Ambulatory Visit: Payer: BC Managed Care – PPO | Admitting: Podiatry

## 2020-07-06 DIAGNOSIS — N3000 Acute cystitis without hematuria: Secondary | ICD-10-CM | POA: Diagnosis not present

## 2020-09-06 DIAGNOSIS — H109 Unspecified conjunctivitis: Secondary | ICD-10-CM | POA: Diagnosis not present

## 2020-10-15 DIAGNOSIS — J029 Acute pharyngitis, unspecified: Secondary | ICD-10-CM | POA: Diagnosis not present

## 2020-10-15 DIAGNOSIS — L539 Erythematous condition, unspecified: Secondary | ICD-10-CM | POA: Diagnosis not present

## 2021-04-03 DIAGNOSIS — N760 Acute vaginitis: Secondary | ICD-10-CM | POA: Diagnosis not present

## 2021-04-12 IMAGING — MG MM DIGITAL DIAGNOSTIC UNILAT*L* W/ TOMO W/ CAD
6 series · 6 of 18 positions shown · non-contrast
Comparison: Previous exam(s).

CLINICAL DATA: 48-year-old female presenting for evaluation of
spontaneous left nipple discharge which is yellow in color. The
patient has history of discharge from the left nipple in 1039. She
had surgical excision of a left breast biopsy site and a duct
excision in 1039 showing 2 intraductal papillomas. She also had
surgical excision of the right breast in 7933 showing a ductal
papilloma.

EXAM:
DIGITAL DIAGNOSTIC LEFT MAMMOGRAM WITH CAD AND TOMO
ULTRASOUND LEFT BREAST

[L MLO synth-2D (1 of 2)]
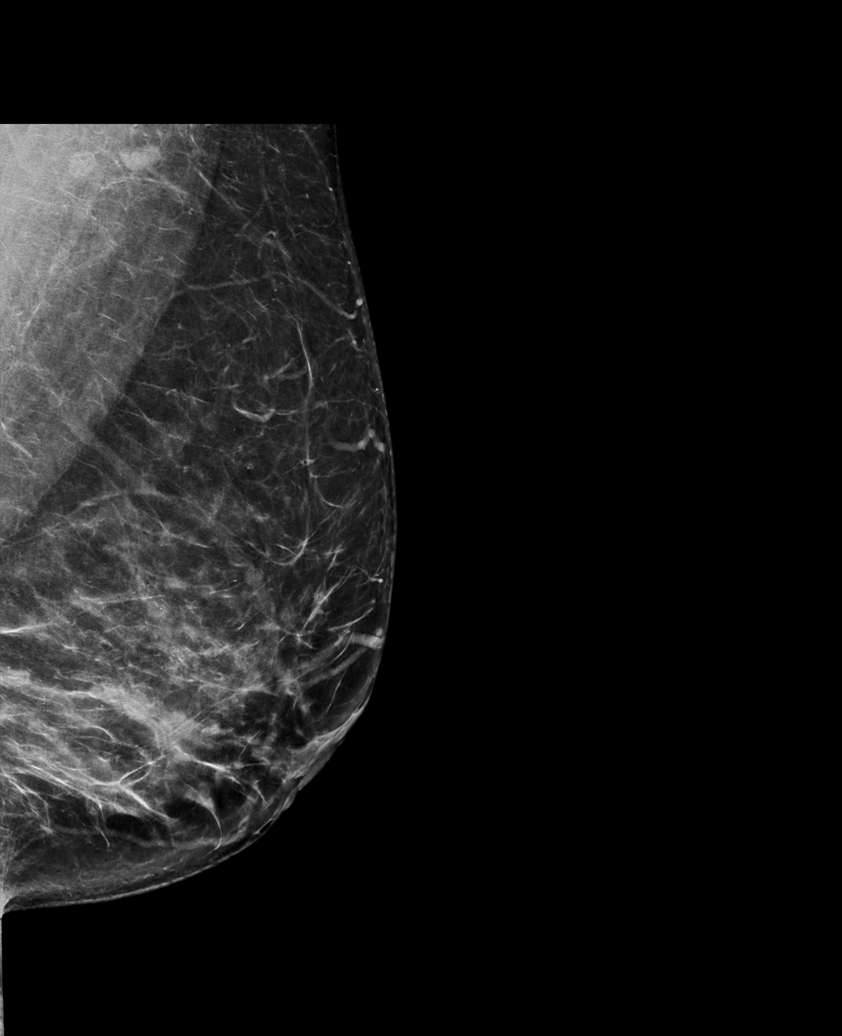

[L CC synth-2D]
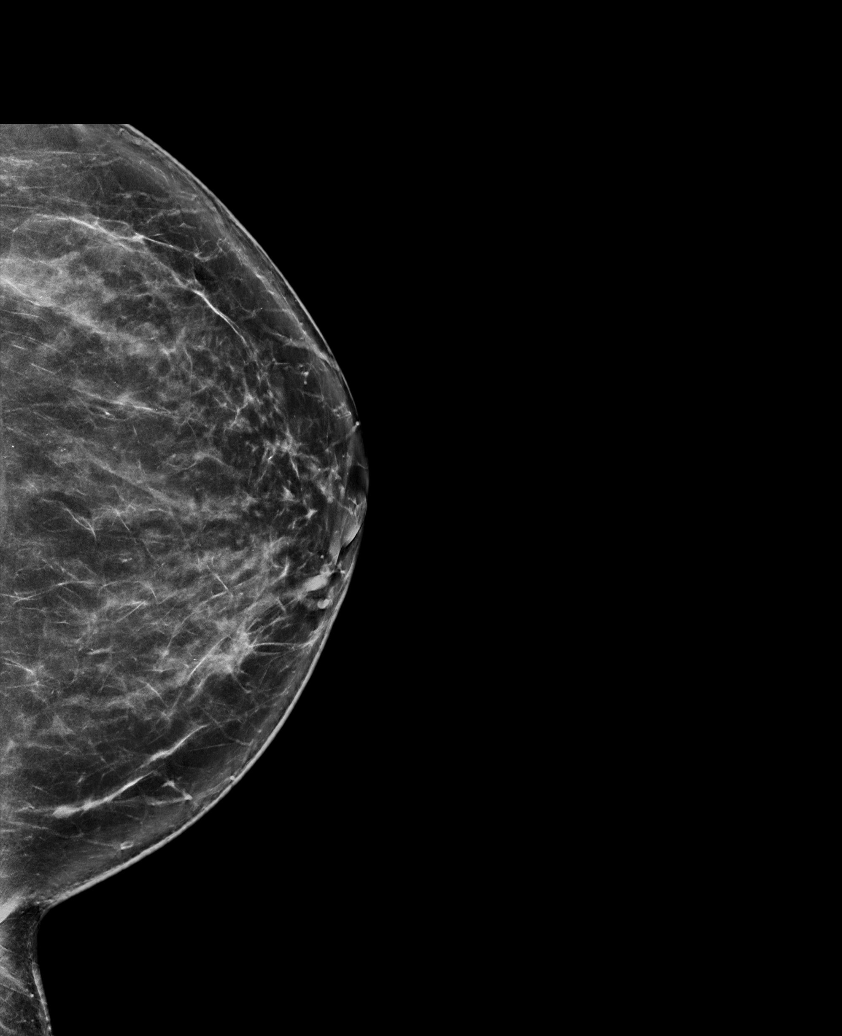

[L MLO synth-2D (2 of 2)]
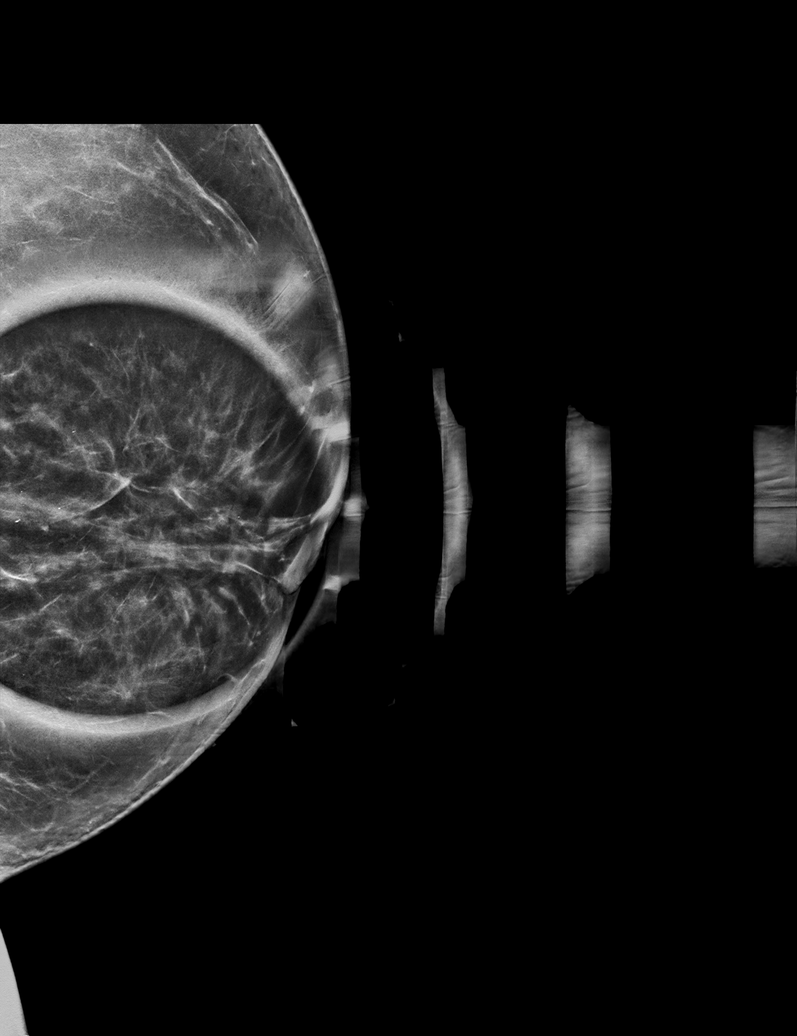

[L MLO tomo (1 of 2) · tomo slice 43/84.0]
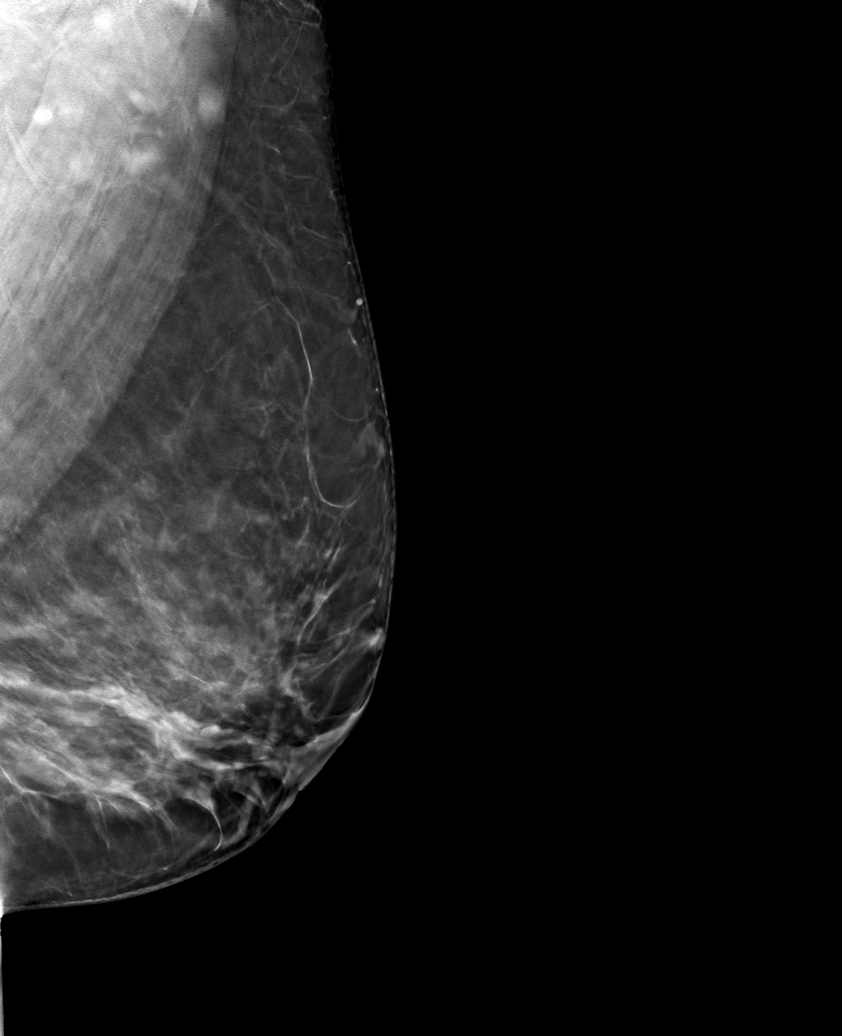

[L CC tomo · tomo slice 45/89.0]
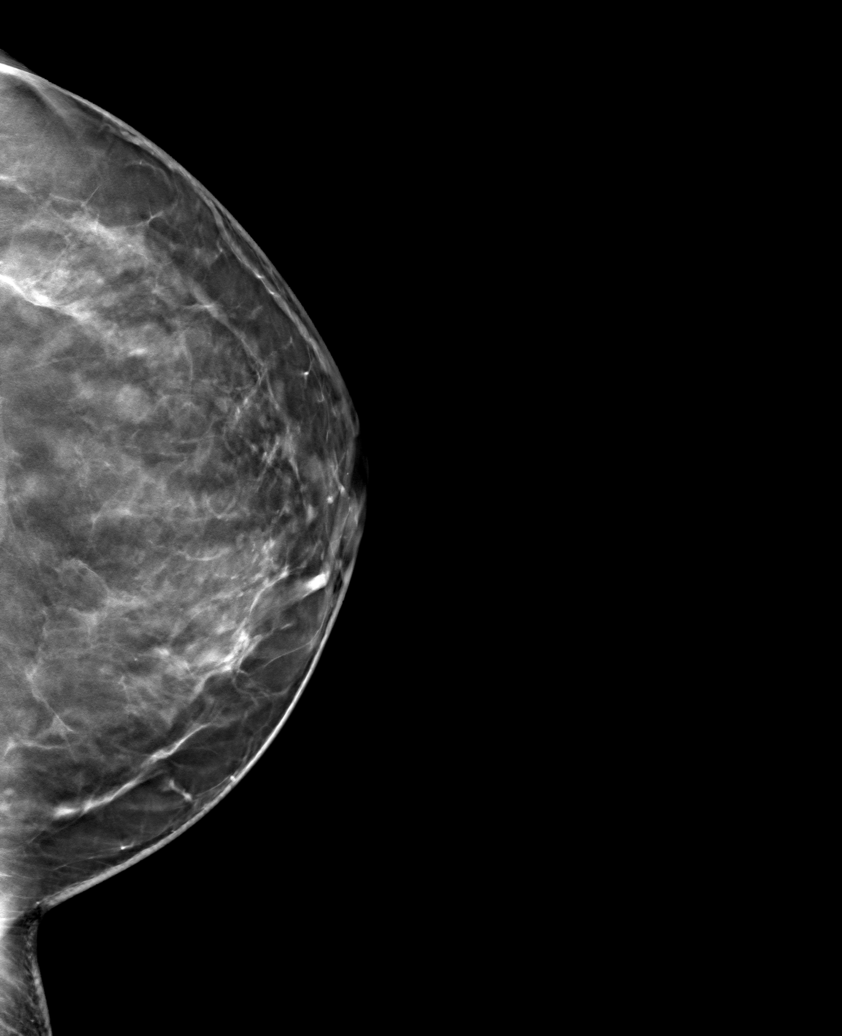

[L MLO tomo (2 of 2) · tomo slice 35/69.0]
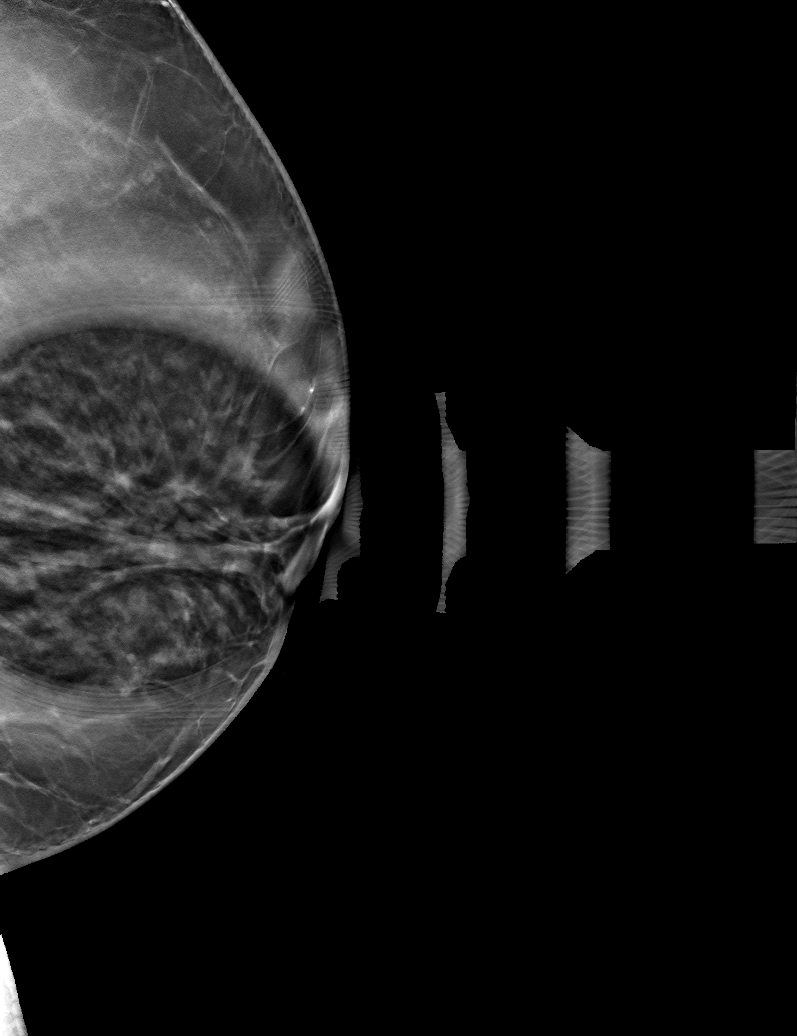

[6 of 18 positions shown; findings below may reference images not displayed]

ACR Breast Density Category c: The breast tissue is heterogeneously
dense, which may obscure small masses.
FINDINGS: No suspicious calcifications, masses or areas of distortion are seen
in the left breast.

Mammographic images were processed with CAD.

Clear to yellow discharge is seen from 1 duct when manually
expressed.

Ultrasound of the retroareolar left breast demonstrates a few
dilated ducts. No intraductal masses are identified.
IMPRESSION: 1. Spontaneous single duct left nipple discharge without a
mammographic or targeted sonographic cause identified. Patient has
history of excised papillomas bilaterally.

RECOMMENDATION:
Bilateral breast MRI is recommended, followed by surgical
consultation for these spontaneous left nipple discharge.

I have discussed the findings and recommendations with the patient.
If applicable, a reminder letter will be sent to the patient
regarding the next appointment.

BI-RADS CATEGORY  2: Benign.

## 2021-04-17 DIAGNOSIS — J069 Acute upper respiratory infection, unspecified: Secondary | ICD-10-CM | POA: Diagnosis not present

## 2021-09-04 DIAGNOSIS — B3731 Acute candidiasis of vulva and vagina: Secondary | ICD-10-CM | POA: Diagnosis not present

## 2021-09-04 DIAGNOSIS — R8290 Unspecified abnormal findings in urine: Secondary | ICD-10-CM | POA: Diagnosis not present

## 2021-10-12 DIAGNOSIS — M7051 Other bursitis of knee, right knee: Secondary | ICD-10-CM | POA: Diagnosis not present

## 2021-10-12 DIAGNOSIS — S86911A Strain of unspecified muscle(s) and tendon(s) at lower leg level, right leg, initial encounter: Secondary | ICD-10-CM | POA: Diagnosis not present

## 2022-03-19 DIAGNOSIS — Z01419 Encounter for gynecological examination (general) (routine) without abnormal findings: Secondary | ICD-10-CM | POA: Diagnosis not present

## 2022-03-19 DIAGNOSIS — Z6833 Body mass index (BMI) 33.0-33.9, adult: Secondary | ICD-10-CM | POA: Diagnosis not present

## 2022-03-19 DIAGNOSIS — Z124 Encounter for screening for malignant neoplasm of cervix: Secondary | ICD-10-CM | POA: Diagnosis not present

## 2022-03-19 DIAGNOSIS — Z1151 Encounter for screening for human papillomavirus (HPV): Secondary | ICD-10-CM | POA: Diagnosis not present

## 2022-03-25 ENCOUNTER — Other Ambulatory Visit: Payer: Self-pay | Admitting: Obstetrics and Gynecology

## 2022-03-25 DIAGNOSIS — N63 Unspecified lump in unspecified breast: Secondary | ICD-10-CM

## 2022-03-27 DIAGNOSIS — J189 Pneumonia, unspecified organism: Secondary | ICD-10-CM | POA: Diagnosis not present

## 2022-03-27 DIAGNOSIS — R0602 Shortness of breath: Secondary | ICD-10-CM | POA: Diagnosis not present

## 2022-03-27 DIAGNOSIS — R051 Acute cough: Secondary | ICD-10-CM | POA: Diagnosis not present

## 2022-03-27 DIAGNOSIS — R430 Anosmia: Secondary | ICD-10-CM | POA: Diagnosis not present

## 2022-03-27 DIAGNOSIS — J209 Acute bronchitis, unspecified: Secondary | ICD-10-CM | POA: Diagnosis not present

## 2022-03-27 DIAGNOSIS — J069 Acute upper respiratory infection, unspecified: Secondary | ICD-10-CM | POA: Diagnosis not present

## 2022-04-16 DIAGNOSIS — Z803 Family history of malignant neoplasm of breast: Secondary | ICD-10-CM | POA: Diagnosis not present

## 2022-04-16 DIAGNOSIS — E042 Nontoxic multinodular goiter: Secondary | ICD-10-CM | POA: Diagnosis not present

## 2022-04-16 DIAGNOSIS — N926 Irregular menstruation, unspecified: Secondary | ICD-10-CM | POA: Diagnosis not present

## 2022-04-16 DIAGNOSIS — Z8041 Family history of malignant neoplasm of ovary: Secondary | ICD-10-CM | POA: Diagnosis not present

## 2022-04-16 DIAGNOSIS — E0500 Thyrotoxicosis with diffuse goiter without thyrotoxic crisis or storm: Secondary | ICD-10-CM | POA: Diagnosis not present

## 2022-04-30 DIAGNOSIS — A5900 Urogenital trichomoniasis, unspecified: Secondary | ICD-10-CM | POA: Diagnosis not present

## 2022-05-08 ENCOUNTER — Encounter: Payer: Self-pay | Admitting: Internal Medicine

## 2022-05-14 ENCOUNTER — Ambulatory Visit
Admission: RE | Admit: 2022-05-14 | Discharge: 2022-05-14 | Disposition: A | Discharge status: Home / Self Care | Payer: BC Managed Care – PPO | Source: Ambulatory Visit | Attending: Obstetrics and Gynecology | Admitting: Obstetrics and Gynecology

## 2022-05-14 DIAGNOSIS — N63 Unspecified lump in unspecified breast: Secondary | ICD-10-CM

## 2022-05-14 DIAGNOSIS — N6489 Other specified disorders of breast: Secondary | ICD-10-CM | POA: Diagnosis not present

## 2022-05-14 DIAGNOSIS — R922 Inconclusive mammogram: Secondary | ICD-10-CM | POA: Diagnosis not present

## 2022-05-21 ENCOUNTER — Other Ambulatory Visit (HOSPITAL_COMMUNITY): Payer: Self-pay | Admitting: Endocrinology

## 2022-05-21 DIAGNOSIS — E559 Vitamin D deficiency, unspecified: Secondary | ICD-10-CM | POA: Diagnosis not present

## 2022-05-21 DIAGNOSIS — E0500 Thyrotoxicosis with diffuse goiter without thyrotoxic crisis or storm: Secondary | ICD-10-CM

## 2022-05-21 DIAGNOSIS — E042 Nontoxic multinodular goiter: Secondary | ICD-10-CM | POA: Diagnosis not present

## 2022-05-21 DIAGNOSIS — R7303 Prediabetes: Secondary | ICD-10-CM | POA: Diagnosis not present

## 2022-06-11 ENCOUNTER — Encounter (HOSPITAL_COMMUNITY): Payer: BC Managed Care – PPO

## 2022-06-11 ENCOUNTER — Encounter (HOSPITAL_COMMUNITY): Payer: Self-pay

## 2022-06-11 ENCOUNTER — Encounter (HOSPITAL_COMMUNITY)
Admission: RE | Admit: 2022-06-11 | Discharge: 2022-06-11 | Disposition: A | Discharge status: Home / Self Care | Payer: BC Managed Care – PPO | Source: Ambulatory Visit | Attending: Endocrinology | Admitting: Endocrinology

## 2022-06-11 DIAGNOSIS — E0500 Thyrotoxicosis with diffuse goiter without thyrotoxic crisis or storm: Secondary | ICD-10-CM | POA: Insufficient documentation

## 2022-06-11 MED ORDER — SODIUM IODIDE I-123 7.4 MBQ CAPS
431.0000 | ORAL_CAPSULE | Freq: Once | ORAL | Status: AC
  Administered 2022-06-11: 431 via ORAL

## 2022-06-12 ENCOUNTER — Encounter (HOSPITAL_COMMUNITY): Payer: BC Managed Care – PPO

## 2022-06-12 ENCOUNTER — Encounter (HOSPITAL_COMMUNITY)
Admission: RE | Admit: 2022-06-12 | Discharge: 2022-06-12 | Disposition: A | Discharge status: Home / Self Care | Payer: BC Managed Care – PPO | Source: Ambulatory Visit | Attending: Endocrinology | Admitting: Endocrinology

## 2022-06-13 DIAGNOSIS — E0590 Thyrotoxicosis, unspecified without thyrotoxic crisis or storm: Secondary | ICD-10-CM | POA: Diagnosis not present

## 2022-06-17 DIAGNOSIS — E042 Nontoxic multinodular goiter: Secondary | ICD-10-CM | POA: Diagnosis not present

## 2022-06-17 DIAGNOSIS — E0500 Thyrotoxicosis with diffuse goiter without thyrotoxic crisis or storm: Secondary | ICD-10-CM | POA: Diagnosis not present

## 2022-06-18 ENCOUNTER — Encounter: Payer: BC Managed Care – PPO | Admitting: Internal Medicine

## 2022-06-18 ENCOUNTER — Other Ambulatory Visit (HOSPITAL_COMMUNITY): Payer: Self-pay | Admitting: Endocrinology

## 2022-06-18 DIAGNOSIS — E0590 Thyrotoxicosis, unspecified without thyrotoxic crisis or storm: Secondary | ICD-10-CM

## 2022-06-20 NOTE — Written Directive (Addendum)
MOLECULAR IMAGING AND THERAPEUTICS WRITTEN DIRECTIVE   PATIENT NAME: Shelby [name]  PT DOB:   May 21, 1970                                              MRN: [id number]  ---------------------------------------------------------------------------------------------------------------------   I-131 WHOLE THYROID THERAPY (NON-CANCER)    RADIOPHARMACEUTICAL:   Iodine-131 Capsule    PRESCRIBED DOSE FOR ADMINISTRATION: 25 mCi   ROUTE OFADMINISTRATION: PO   DIAGNOSIS:  Hyperthyroidism   REFERRING PHYSICIAN:Bindubal Balan   TSH:   0.005    PRIOR I-131 THERAPY (Date and Dose):   PRIOR RADIOLOGY EXAMS (Results and Date): NM THYROID MULT UPTAKE W/IMAGING  Result Date: 06/13/2022 CLINICAL DATA:  Evaluate thyroid uptake.  Graves disease. EXAM: THYROID SCAN AND UPTAKE - 4 AND 24 HOURS TECHNIQUE: Following oral administration of I-123 capsule, anterior planar imaging was acquired at 24 hours. Thyroid uptake was calculated with a thyroid probe at 4-6 hours and 24 hours. RADIOPHARMACEUTICALS:  Four hundred thirty-one uCi I-123 sodium iodide p.o. COMPARISON:  None Available. FINDINGS: Heterogeneous uptake is identified throughout both thyroid lobes, particularly the right consistent with multinodular goiter. 4 hour I-123 uptake = 35.1% (normal 5-20%) 24 hour I-123 uptake = 49.1% (normal 10-30%) IMPRESSION: 1. Heterogeneous uptake throughout the thyroid lobes consistent with a multinodular goiter. 2. Elevated iodine uptake in the thyroid consistent with history of hyperthyroidism. Electronically Signed   By: Gerome Sam III M.D.   On: 06/13/2022 18:12      ADDITIONAL PHYSICIAN COMMENTS/NOTES Multinodular Graves vs Toxic multinodular goiter.  AUTHORIZED USER SIGNATURE & TIME STAMP: Patriciaann Clan, MD   06/21/22    7:48 AM

## 2022-07-02 DIAGNOSIS — R945 Abnormal results of liver function studies: Secondary | ICD-10-CM | POA: Diagnosis not present

## 2022-07-02 DIAGNOSIS — R739 Hyperglycemia, unspecified: Secondary | ICD-10-CM | POA: Diagnosis not present

## 2022-07-02 DIAGNOSIS — E559 Vitamin D deficiency, unspecified: Secondary | ICD-10-CM | POA: Diagnosis not present

## 2022-07-09 ENCOUNTER — Ambulatory Visit (HOSPITAL_COMMUNITY)
Admission: RE | Admit: 2022-07-09 | Discharge: 2022-07-09 | Disposition: A | Discharge status: Home / Self Care | Payer: BC Managed Care – PPO | Source: Ambulatory Visit | Attending: Endocrinology | Admitting: Endocrinology

## 2022-07-23 ENCOUNTER — Encounter (HOSPITAL_COMMUNITY)
Admission: RE | Admit: 2022-07-23 | Discharge: 2022-07-23 | Disposition: A | Discharge status: Home / Self Care | Payer: BC Managed Care – PPO | Source: Ambulatory Visit | Attending: Endocrinology | Admitting: Endocrinology

## 2022-07-23 DIAGNOSIS — E0590 Thyrotoxicosis, unspecified without thyrotoxic crisis or storm: Secondary | ICD-10-CM | POA: Insufficient documentation

## 2022-07-23 MED ORDER — SODIUM IODIDE I 131 CAPSULE
25.0000 | Freq: Once | INTRAVENOUS | Status: AC | PRN
  Administered 2022-07-23: 25 via ORAL

## 2022-07-30 DIAGNOSIS — E0500 Thyrotoxicosis with diffuse goiter without thyrotoxic crisis or storm: Secondary | ICD-10-CM | POA: Diagnosis not present

## 2022-07-30 DIAGNOSIS — E559 Vitamin D deficiency, unspecified: Secondary | ICD-10-CM | POA: Diagnosis not present

## 2022-07-30 DIAGNOSIS — R7303 Prediabetes: Secondary | ICD-10-CM | POA: Diagnosis not present

## 2022-07-30 DIAGNOSIS — E042 Nontoxic multinodular goiter: Secondary | ICD-10-CM | POA: Diagnosis not present

## 2022-10-01 DIAGNOSIS — E0500 Thyrotoxicosis with diffuse goiter without thyrotoxic crisis or storm: Secondary | ICD-10-CM | POA: Diagnosis not present

## 2022-10-01 DIAGNOSIS — E559 Vitamin D deficiency, unspecified: Secondary | ICD-10-CM | POA: Diagnosis not present

## 2022-10-01 DIAGNOSIS — R7303 Prediabetes: Secondary | ICD-10-CM | POA: Diagnosis not present

## 2022-10-01 DIAGNOSIS — E042 Nontoxic multinodular goiter: Secondary | ICD-10-CM | POA: Diagnosis not present

## 2022-10-15 DIAGNOSIS — E042 Nontoxic multinodular goiter: Secondary | ICD-10-CM | POA: Diagnosis not present

## 2022-10-15 DIAGNOSIS — E0500 Thyrotoxicosis with diffuse goiter without thyrotoxic crisis or storm: Secondary | ICD-10-CM | POA: Diagnosis not present

## 2022-10-15 DIAGNOSIS — R7303 Prediabetes: Secondary | ICD-10-CM | POA: Diagnosis not present

## 2022-10-15 DIAGNOSIS — E559 Vitamin D deficiency, unspecified: Secondary | ICD-10-CM | POA: Diagnosis not present

## 2022-11-07 ENCOUNTER — Inpatient Hospital Stay (HOSPITAL_COMMUNITY)
Admission: AD | Admit: 2022-11-07 | Discharge: 2022-11-07 | Disposition: A | Discharge status: Home / Self Care | Payer: BC Managed Care – PPO | Attending: Obstetrics and Gynecology | Admitting: Obstetrics and Gynecology

## 2022-11-07 ENCOUNTER — Other Ambulatory Visit: Payer: Self-pay

## 2022-11-07 DIAGNOSIS — E0500 Thyrotoxicosis with diffuse goiter without thyrotoxic crisis or storm: Secondary | ICD-10-CM | POA: Insufficient documentation

## 2022-11-07 DIAGNOSIS — N938 Other specified abnormal uterine and vaginal bleeding: Secondary | ICD-10-CM | POA: Insufficient documentation

## 2022-11-07 DIAGNOSIS — N951 Menopausal and female climacteric states: Secondary | ICD-10-CM

## 2022-11-07 DIAGNOSIS — E0590 Thyrotoxicosis, unspecified without thyrotoxic crisis or storm: Secondary | ICD-10-CM | POA: Insufficient documentation

## 2022-11-07 DIAGNOSIS — Z3202 Encounter for pregnancy test, result negative: Secondary | ICD-10-CM | POA: Diagnosis not present

## 2022-11-07 DIAGNOSIS — N939 Abnormal uterine and vaginal bleeding, unspecified: Secondary | ICD-10-CM

## 2022-11-07 DIAGNOSIS — D259 Leiomyoma of uterus, unspecified: Secondary | ICD-10-CM | POA: Diagnosis not present

## 2022-11-07 LAB — CBC
HCT: 39 % (ref 36.0–46.0)
Hemoglobin: 13.2 g/dL (ref 12.0–15.0)
MCH: 28.4 pg (ref 26.0–34.0)
MCHC: 33.8 g/dL (ref 30.0–36.0)
MCV: 83.9 fL (ref 80.0–100.0)
Platelets: 284 10*3/uL (ref 150–400)
RBC: 4.65 MIL/uL (ref 3.87–5.11)
RDW: 13.1 % (ref 11.5–15.5)
WBC: 6 10*3/uL (ref 4.0–10.5)
nRBC: 0 % (ref 0.0–0.2)

## 2022-11-07 LAB — HCG, QUANTITATIVE, PREGNANCY: hCG, Beta Chain, Quant, S: <1 m[IU]/mL (ref <5)

## 2022-11-07 MED ORDER — TRANEXAMIC ACID 650 MG PO TABS
1300.0000 mg | ORAL_TABLET | Freq: Three times a day (TID) | ORAL | Status: DC
  Administered 2022-11-07: 1300 mg via ORAL
  Filled 2022-11-07 (×2): qty 2

## 2022-11-07 MED ORDER — TRANEXAMIC ACID 650 MG PO TABS: 1300.0000 mg | ORAL_TABLET | Freq: Three times a day (TID) | ORAL | 2 refills | Status: AC

## 2022-11-07 NOTE — MAU Note (Signed)
Shelby [name] is a 52 y.o. at Unknown here in MAU reporting: she began her period yesterday and is experiencing heavy bleeding.  States she's saturating through her clothing.  Reports she's changing pads (doubled) every 1.5 hours.  Reports she's passing intermittent small/medium clots.  States doesn't recall having a cycle in August.  States she's been told she has a Hx of small fibroids.   LMP: 11/06/2022 Onset of complaint: today Pain score: 0 Vitals:   11/07/22 1323  BP: (!) 140/95  Pulse: 68  Resp: 18  Temp: 97.9 F (36.6 C)  SpO2: 98%     FHT:NA Lab orders placed from triage:   None

## 2022-11-07 NOTE — MAU Provider Note (Signed)
History     CSN: [id number]  Arrival date and time: 11/07/22 1302   None     Chief Complaint  Patient presents with   Vaginal Bleeding   Shelby [name] is a 52 y.o. at Unknown here in MAU reporting: she began her period yesterday and is experiencing heavy bleeding.  States she's saturating through her clothing.  Reports she's changing pads (doubled) every 1.5 hours.  Reports she's passing intermittent small/medium clots.  States doesn't recall having a cycle in August.  States she's been told she has a Hx of small fibroids. Denies abdominal or pelvic pain or cramping.  LMP: 11/06/2022     OB History   No obstetric history on file.     Past Medical History:  Diagnosis Date   Anemia    Anxiety    Depression    Neuromuscular disorder (HCC)    muscle spasms   Sickle cell trait (HCC)    Sickle cell trait (HCC) 1992   UTI (lower urinary tract infection)     Past Surgical History:  Procedure Laterality Date   BREAST BIOPSY Left 3/29/201/7   BREAST BIOPSY Right 06/17/2013   BREAST EXCISIONAL BIOPSY Left 07/17/2015   BREAST EXCISIONAL BIOPSY Right 07/13/2013   BREAST SURGERY     right breast seed guided biopsy   CESAREAN SECTION     CHOLECYSTECTOMY N/A 07/13/2013   Procedure: LAPAROSCOPIC CHOLECYSTECTOMY;  Surgeon: Emelia Loron, MD;  Location: [facility];  Service: General;  Laterality: N/A;   DILATION AND CURETTAGE OF UTERUS  1993   Miscarriage   RADIOACTIVE SEED GUIDED EXCISIONAL BREAST BIOPSY Right 07/17/2015   Procedure: RIGHT BREAST RADIOACTIVE SEED GUIDED EXCISIONAL  BIOPSY;  Surgeon: Emelia Loron, MD;  Location: [facility];  Service: General;  Laterality: Right;    No family history on file.  Social History   Tobacco Use   Smoking status: Former    Current packs/day: 0.00    Types: Cigarettes    Quit date: 06/26/2011    Years since quitting: 11.3  Substance Use Topics   Alcohol use: Yes    Comment: rarely    Drug use: No    Allergies:  Allergies  Allergen Reactions   Latex Rash    No medications prior to admission.    Review of Systems  Constitutional:  Positive for fatigue. Negative for chills, diaphoresis and fever.  Respiratory:  Negative for chest tightness and shortness of breath.   Cardiovascular:  Negative for chest pain and palpitations.  Gastrointestinal:  Negative for abdominal pain, diarrhea, nausea and vomiting.  Genitourinary:  Positive for vaginal bleeding. Negative for pelvic pain and vaginal pain.  Neurological:  Negative for dizziness, syncope, weakness, light-headedness and headaches.   Physical Exam   Blood pressure (!) 143/87, pulse 68, temperature 97.9 F (36.6 C), temperature source Oral, resp. rate 18, height 4' 5.5" (1.359 m), weight 87 kg, last menstrual period 11/06/2022, SpO2 99%.  Physical Exam Constitutional:      General: She is not in acute distress.    Appearance: Normal appearance.  HENT:     Head: Normocephalic and atraumatic.  Cardiovascular:     Rate and Rhythm: Normal rate and regular rhythm.     Pulses: Normal pulses.     Heart sounds: Normal heart sounds. No murmur heard.    No friction rub. No gallop.  Pulmonary:     Effort: Pulmonary effort is normal. No respiratory distress.     Breath sounds:  Normal breath sounds. No stridor. No wheezing.  Chest:     Chest wall: No tenderness.  Abdominal:     General: Abdomen is flat. There is no distension.     Palpations: Abdomen is soft. There is no mass.     Tenderness: There is no abdominal tenderness. There is no guarding or rebound.  Skin:    General: Skin is warm and dry.  Neurological:     General: No focal deficit present.     Mental Status: She is alert and oriented to person, place, and time.  Psychiatric:        Mood and Affect: Mood normal.        Behavior: Behavior normal.    Labs Results for orders placed or performed during the hospital encounter of 11/07/22 (from the  past 24 hour(s))  CBC     Status: None   Collection Time: 11/07/22  2:02 PM  Result Value Ref Range   WBC 6.0 4.0 - 10.5 K/uL   RBC 4.65 3.87 - 5.11 MIL/uL   Hemoglobin 13.2 12.0 - 15.0 g/dL   HCT 13.2 44.0 - 10.2 %   MCV 83.9 80.0 - 100.0 fL   MCH 28.4 26.0 - 34.0 pg   MCHC 33.8 30.0 - 36.0 g/dL   RDW 72.5 36.6 - 44.0 %   Platelets 284 150 - 400 K/uL   nRBC 0.0 0.0 - 0.2 %  hCG, quantitative, pregnancy     Status: None   Collection Time: 11/07/22  2:02 PM  Result Value Ref Range   hCG, Beta Chain, Quant, S <1 <5 mIU/mL     Imaging N/a  MAU Course   Procedures Lab Orders  No laboratory test(s) ordered today   No orders of the defined types were placed in this encounter.  Imaging Orders  No imaging studies ordered today    MDM Moderate (Level 3)  Assessment and Plan  #Abnormal Uterine Bleeding TXA  Hgb and PLTs WNL - no concern for anemia.  Follow up with Gyn   Dispo: discharged to home in stable condition  Lilly McGonegal, Student-PA/MPH 11/07/22 1:28 PM  Allergies as of 11/07/2022       Reactions   Latex Rash      Attestation of Supervision of Student:  I confirm that I have verified the information documented in the physician assistant student's note and that I have also personally reperformed the history, physical exam and all medical decision making activities.  I have verified that all services and findings are accurately documented in this student's note; and I agree with management and plan as outlined in the documentation. I have also made any necessary editorial changes.  Wyn Forster, MD [facility] for [facility], [facility] 11/07/2022 3:58 PM

## 2022-12-17 DIAGNOSIS — E559 Vitamin D deficiency, unspecified: Secondary | ICD-10-CM | POA: Diagnosis not present

## 2022-12-17 DIAGNOSIS — E0500 Thyrotoxicosis with diffuse goiter without thyrotoxic crisis or storm: Secondary | ICD-10-CM | POA: Diagnosis not present

## 2022-12-17 DIAGNOSIS — E042 Nontoxic multinodular goiter: Secondary | ICD-10-CM | POA: Diagnosis not present

## 2023-04-15 DIAGNOSIS — E0500 Thyrotoxicosis with diffuse goiter without thyrotoxic crisis or storm: Secondary | ICD-10-CM | POA: Diagnosis not present

## 2023-04-15 DIAGNOSIS — E042 Nontoxic multinodular goiter: Secondary | ICD-10-CM | POA: Diagnosis not present

## 2023-04-15 DIAGNOSIS — R7303 Prediabetes: Secondary | ICD-10-CM | POA: Diagnosis not present

## 2023-04-15 DIAGNOSIS — E559 Vitamin D deficiency, unspecified: Secondary | ICD-10-CM | POA: Diagnosis not present

## 2023-04-22 DIAGNOSIS — R7303 Prediabetes: Secondary | ICD-10-CM | POA: Diagnosis not present

## 2023-04-22 DIAGNOSIS — E042 Nontoxic multinodular goiter: Secondary | ICD-10-CM | POA: Diagnosis not present

## 2023-04-22 DIAGNOSIS — E0500 Thyrotoxicosis with diffuse goiter without thyrotoxic crisis or storm: Secondary | ICD-10-CM | POA: Diagnosis not present

## 2023-04-22 DIAGNOSIS — E559 Vitamin D deficiency, unspecified: Secondary | ICD-10-CM | POA: Diagnosis not present

## 2023-06-04 DIAGNOSIS — M25512 Pain in left shoulder: Secondary | ICD-10-CM | POA: Diagnosis not present

## 2023-06-04 DIAGNOSIS — M545 Low back pain, unspecified: Secondary | ICD-10-CM | POA: Diagnosis not present

## 2023-06-24 DIAGNOSIS — E890 Postprocedural hypothyroidism: Secondary | ICD-10-CM | POA: Diagnosis not present

## 2023-07-20 ENCOUNTER — Encounter (HOSPITAL_BASED_OUTPATIENT_CLINIC_OR_DEPARTMENT_OTHER): Payer: Self-pay | Admitting: Emergency Medicine

## 2023-07-20 ENCOUNTER — Emergency Department (HOSPITAL_BASED_OUTPATIENT_CLINIC_OR_DEPARTMENT_OTHER)
Admission: EM | Admit: 2023-07-20 | Discharge: 2023-07-20 | Disposition: A | Discharge status: Home / Self Care | Attending: Emergency Medicine | Admitting: Emergency Medicine

## 2023-07-20 ENCOUNTER — Emergency Department (HOSPITAL_BASED_OUTPATIENT_CLINIC_OR_DEPARTMENT_OTHER)

## 2023-07-20 DIAGNOSIS — M25512 Pain in left shoulder: Secondary | ICD-10-CM | POA: Diagnosis not present

## 2023-07-20 DIAGNOSIS — E039 Hypothyroidism, unspecified: Secondary | ICD-10-CM | POA: Diagnosis not present

## 2023-07-20 DIAGNOSIS — Z91040 Latex allergy status: Secondary | ICD-10-CM | POA: Insufficient documentation

## 2023-07-20 HISTORY — DX: Disorder of thyroid, unspecified: E07.9

## 2023-07-20 MED ORDER — DEXAMETHASONE SODIUM PHOSPHATE 10 MG/ML IJ SOLN
10.0000 mg | Freq: Once | INTRAMUSCULAR | Status: AC
  Administered 2023-07-20: 10 mg via INTRAMUSCULAR
  Filled 2023-07-20: qty 1

## 2023-07-20 NOTE — ED Triage Notes (Signed)
 Left arm/ shoulder pain Started in December while holding granddaughter  Now has gotten worse and shots down arm Reports loss of function and range of motion Movement causes pain, ok  at rest

## 2023-07-20 NOTE — ED Provider Notes (Signed)
 [facility] AT [facility] Provider Note   CSN: [id number] Arrival date & time: 07/20/23  1243     History  Chief Complaint  Patient presents with   Shoulder Pain    Shelby [name] is a 53 y.o. female.  Patient with history of hypothyroidism --presents to the emergency department for evaluation of worsening left shoulder pain.  The first time she felt pain in her left shoulder was when she was lifting her infant granddaughter in December 2024.  Patient states that over the past week the symptoms have gotten a lot worse.  She has 10 out of 10 pain with certain movements especially external rotation and flexion of the shoulder.  She reports decreased range of motion.  No distal numbness or tingling.  No new falls or injuries.  States that she was seen prior, had an x-ray which was negative.  She does not have an orthopedist.       Home Medications Prior to Admission medications   Medication Sig Start Date End Date Taking? Authorizing Provider  famotidine  (PEPCID ) 20 MG tablet Take 1 tablet (20 mg total) by mouth 2 (two) times daily for 5 days. 05/29/17 06/03/17  Murrill, Samantha, FNP  meloxicam (MOBIC) 15 MG tablet meloxicam 15 mg tablet  TAKE 1 TABLET BY MOUTH ONCE DAILY    [provider]  methimazole (TAPAZOLE) 10 MG tablet methimazole 10 mg tablet  TAKE 2 TABLETS BY MOUTH IN THE MORNING AND 1 AT NIGHT    [provider]  triamcinolone cream (KENALOG) 0.1 % triamcinolone acetonide 0.1 % topical cream    [provider]      Allergies    Latex    Review of Systems   Review of Systems  Physical Exam Updated Vital Signs BP (!) 150/99 (BP Location: Right Arm)   Pulse 98   Temp 98.7 F (37.1 C) (Oral)   Resp 18   LMP 07/13/2023   SpO2 100%   Physical Exam Vitals and nursing note reviewed.  Constitutional:      Appearance: She is well-developed.  HENT:     Head: Normocephalic and atraumatic.  Eyes:      Conjunctiva/sclera: Conjunctivae normal.  Pulmonary:     Effort: No respiratory distress.  Musculoskeletal:     Left shoulder: Tenderness present. No swelling or bony tenderness. Decreased range of motion.     Left upper arm: No swelling.     Left elbow: Normal range of motion.     Left wrist: No tenderness. Normal range of motion. Normal pulse.     Cervical back: Normal range of motion and neck supple.  Skin:    General: Skin is warm and dry.  Neurological:     Mental Status: She is alert.     ED Results / Procedures / Treatments   Labs (all labs ordered are listed, but only abnormal results are displayed) Labs Reviewed - No data to display  EKG None  Radiology No results found.  Procedures Procedures    Medications Ordered in ED Medications - No data to display  ED Course/ Medical Decision Making/ A&P    Patient seen and examined. History obtained directly from patient.   Labs/EKG: None ordered  Imaging: Ordered x-ray of the left shoulder  Medications/Fluids: None ordered  Most recent vital signs reviewed and are as follows: BP (!) 150/99 (BP Location: Right Arm)   Pulse 98   Temp 98.7 F (37.1 C) (Oral)   Resp  18   LMP 07/13/2023   SpO2 100%   Initial impression: Shoulder pain, gradually worsening, loss of range of motion.  Adhesive capsulitis considered as well as rotator cuff injury, tendinitis.  Symptoms are not suggestive of ACS or PE at this time.  2:27 PM Reassessment performed. Patient appears stable.  Labs personally reviewed and interpreted including: None ordered  Imaging personally visualized and interpreted including: X-ray of the shoulder, agree no dislocation or fracture.  Reviewed pertinent lab work and imaging with patient at bedside. Questions answered.   Most current vital signs reviewed and are as follows: BP (!) 150/99 (BP Location: Right Arm)   Pulse 98   Temp 98.7 F (37.1 C) (Oral)   Resp 18   LMP 07/13/2023   SpO2  100%   Plan: Discharge to home.   Prescriptions written for: None  Other home care instructions discussed: Continue meloxicam at home  ED return instructions discussed: New or worsening symptoms  Follow-up instructions discussed: Patient encouraged to follow-up with orthopedics in the next several days.                                Medical Decision Making Amount and/or Complexity of Data Reviewed Radiology: ordered.  Risk Prescription drug management.   Patient with left shoulder pain, progressively worsening, now with decreased range of motion.  X-rays negative.  Left upper extremity is neurovascularly intact.  Patient will need to follow-up with orthopedics.  Given dose of IM Decadron  today to see if this will help with symptoms in the short-term.  Continue home NSAIDs.        Final Clinical Impression(s) / ED Diagnoses Final diagnoses:  Acute pain of left shoulder    Rx / DC Orders ED Discharge Orders     None         Lyna Sandhoff, PA-C 07/20/23 1428    Lind Repine, MD 07/21/23 1225

## 2023-07-20 NOTE — Discharge Instructions (Signed)
 Please read and follow all provided instructions.  Your diagnoses today include:  1. Acute pain of left shoulder    Tests performed today include: An x-ray of the affected area - does NOT show any broken bones Vital signs. See below for your results today.   Medications prescribed:  None  Take any prescribed medications only as directed.  Home care instructions:  Follow any educational materials contained in this packet Follow R.I.C.E. Protocol: R - rest your injury  I  - use ice on injury without applying directly to skin C - compress injury with bandage or splint E - elevate the injury as much as possible  Follow-up instructions: Please follow-up with the provided orthopedic physician.  Return instructions:  Please return if your fingers are numb or tingling, appear gray or blue, or you have severe pain (also elevate the arm and loosen splint or wrap if you were given one) Please return to the Emergency Department if you experience worsening symptoms.  Please return if you have any other emergent concerns.  Additional Information:  Your vital signs today were: BP (!) 150/99 (BP Location: Right Arm)   Pulse 98   Temp 98.7 F (37.1 C) (Oral)   Resp 18   LMP 07/13/2023   SpO2 100%  If your blood pressure (BP) was elevated above 135/85 this visit, please have this repeated by your doctor within one month. --------------

## 2023-08-26 DIAGNOSIS — E890 Postprocedural hypothyroidism: Secondary | ICD-10-CM | POA: Diagnosis not present

## 2023-09-23 DIAGNOSIS — N760 Acute vaginitis: Secondary | ICD-10-CM | POA: Diagnosis not present

## 2023-09-23 DIAGNOSIS — R102 Pelvic and perineal pain: Secondary | ICD-10-CM | POA: Diagnosis not present

## 2023-10-21 DIAGNOSIS — E890 Postprocedural hypothyroidism: Secondary | ICD-10-CM | POA: Diagnosis not present

## 2023-10-28 DIAGNOSIS — E559 Vitamin D deficiency, unspecified: Secondary | ICD-10-CM | POA: Diagnosis not present

## 2023-10-28 DIAGNOSIS — E042 Nontoxic multinodular goiter: Secondary | ICD-10-CM | POA: Diagnosis not present

## 2023-10-28 DIAGNOSIS — E0500 Thyrotoxicosis with diffuse goiter without thyrotoxic crisis or storm: Secondary | ICD-10-CM | POA: Diagnosis not present

## 2023-10-28 DIAGNOSIS — R7303 Prediabetes: Secondary | ICD-10-CM | POA: Diagnosis not present

## 2023-12-22 ENCOUNTER — Telehealth: Payer: Self-pay

## 2023-12-22 NOTE — Telephone Encounter (Unsigned)
 Copied from CRM [id number]. Topic: Clinical - Refused Triage >> Dec 22, 2023 12:00 PM Mia F wrote: Patient/caller voiced complaints of Pt says she is in a lot of pain and wants to see her dr sooner. No other appts are available at this time. NT was offered to pt but she says she think she need something more than a nurse. . Declined transfer to triage.

## 2023-12-22 NOTE — Telephone Encounter (Signed)
 I called patient and made her aware that she could go to the mobile [facility] today however she stated she is unable because she is at work.

## 2023-12-29 ENCOUNTER — Ambulatory Visit: Payer: Self-pay

## 2023-12-29 NOTE — Telephone Encounter (Signed)
 FYI Only or Action Required?: FYI only for provider: appointment scheduled on 01/02/2024.  Patient was last seen in primary care on unknown.  Called Nurse Triage reporting Joint Pain.  Symptoms began several years ago.  Interventions attempted: Nothing.  Symptoms are: gradually worsening.  Triage Disposition: See HCP Within 4 Hours (Or PCP Triage)  Patient/caregiver understands and will follow disposition?: Unsure           Copied from CRM #[id number]. Topic: Clinical - Red Word Triage >> Dec 29, 2023 11:20 AM Antony RAMAN wrote: Red Word that prompted transfer to Nurse Triage: pain in leg joints, left arm lost ROM/cant lift, legs get a numb kind of feeling, when walking she gets back spasms Reason for Disposition  [1] SEVERE back pain (e.g., excruciating, unable to do any normal activities) AND [2] not improved 2 hours after pain medicine  Answer Assessment - Initial Assessment Questions Patient requesting to have an earlier appointment to establish care with a PCP. Patient is declining to be seen anywhere else for symptoms. She states she has been seen at multiple places and they state they do not see any issues. This RN scheduled pt earlier in office with another provider for a new pt appt.     1. ONSET: When did the pain begin? (e.g., minutes, hours, days)     Approximately around 2020 2. LOCATION: Where does it hurt? (upper, mid or lower back)     All over her back and entire body  3. SEVERITY: How bad is the pain?  (e.g., Scale 1-10; mild, moderate, or severe)     Chronic and severe 4. NEUROLOGIC SYMPTOMS: Do you have any weakness, numbness, or problems with bowel/bladder control?     Weakness and numbness  5. OTHER SYMPTOMS: Do you have any other symptoms? (e.g., fever, abdomen pain, burning with urination, blood in urine)       Pain in all of her joint areas, spasms  Protocols used: Back Pain-A-AH

## 2024-01-02 ENCOUNTER — Ambulatory Visit: Admitting: Family Medicine

## 2024-01-02 ENCOUNTER — Ambulatory Visit (INDEPENDENT_AMBULATORY_CARE_PROVIDER_SITE_OTHER): Admitting: Family Medicine

## 2024-01-02 VITALS — BP 134/92 | HR 70 | Ht 63.0 in | Wt 214.6 lb

## 2024-01-02 DIAGNOSIS — M25551 Pain in right hip: Secondary | ICD-10-CM | POA: Diagnosis not present

## 2024-01-02 DIAGNOSIS — I10 Essential (primary) hypertension: Secondary | ICD-10-CM

## 2024-01-02 DIAGNOSIS — M5412 Radiculopathy, cervical region: Secondary | ICD-10-CM | POA: Diagnosis not present

## 2024-01-02 DIAGNOSIS — M25559 Pain in unspecified hip: Secondary | ICD-10-CM | POA: Diagnosis not present

## 2024-01-02 DIAGNOSIS — Z1331 Encounter for screening for depression: Secondary | ICD-10-CM

## 2024-01-02 DIAGNOSIS — G8929 Other chronic pain: Secondary | ICD-10-CM

## 2024-01-02 DIAGNOSIS — M25511 Pain in right shoulder: Secondary | ICD-10-CM

## 2024-01-02 NOTE — Patient Instructions (Signed)
 Please go get your xrays done at [facility]. You do not need to make an appointment. You can just show up.   Address: [address] [place], [place], [place] [address]

## 2024-01-02 NOTE — Progress Notes (Addendum)
 Name: Shelby [name]   Date of Visit: 01/02/24   Date of last visit with me: Visit date not found   CHIEF COMPLAINT:  Chief Complaint  Patient presents with   Establish Care    New patient. Has FMLA paperwork, has graves diease, sickle cell trait, hypothyroidism had radiation to try to remove, has back spasms, feels like throat is coming out, pain, jumps, lost range of motion in left arm, problems with standing and walking for short periods of times, muscle aches, sore hips, sits in pain all day, vitamin d deficient, has depression.  Think she has sleep apnea.        HPI:  Discussed the use of AI scribe software for clinical note transcription with the patient, who gave verbal consent to proceed.  History of Present Illness Shelby [name] is a 53 year old female who presents with chronic back and hip pain. She is accompanied by her son, Joesph.  She experiences daily back spasms that persist despite treatments such as muscle relaxers, meloxicam, and injections. She reports that previous imaging studies, including x-rays, were interpreted by other clinicians as showing no skeletal abnormalities. The pain is described as a burning sensation in the lower back and pelvis, radiating into the hip area, making it difficult to stand up straight.  She describes significant hip pain, particularly on the right side. The pain radiates down her leg and is associated with a sensation of her pelvis being misaligned. She experiences difficulty with walking and reports that her legs and feet swell, feeling tight and swollen. She occasionally takes ibuprofen for pain relief but finds it insufficient.  She reports a loss of range of motion in her left arm, which she attributes to holding her granddaughter in December 2024. The pain has progressively worsened, and she now experiences burning in the shoulder, with pain radiating down the arm and occasional numbness in the thumb. No  specific injury to the arm is noted.  She mentions experiencing throat spasms that feel like a bulging sensation, triggered by head movements or yawning. She is currently taking levothyroxine for thyroid  management.  Her blood pressure is noted to be high, and she reports mood symptoms, for which she is taking sertraline 100 mg. However, she feels it is not very effective and recalls a previous medication, possibly Lexapro, that was more beneficial.     OBJECTIVE:       01/02/2024    4:01 PM  Depression screen PHQ 2/9  Decreased Interest 3  Down, Depressed, Hopeless 3  PHQ - 2 Score 6  Altered sleeping 3  Tired, decreased energy 3  Change in appetite 3  Feeling bad or failure about yourself  3  Trouble concentrating 0  Moving slowly or fidgety/restless 1  Suicidal thoughts 0  PHQ-9 Score 19     BP Readings from Last 3 Encounters:  01/02/24 (!) 134/92  07/20/23 (!) 148/94  11/07/22 (!) 143/87    BP (!) 134/92   Pulse 70   Ht 5' 3 (1.6 m)   Wt 214 lb 9.6 oz (97.3 kg)   SpO2 98%   BMI 38.01 kg/m    Physical Exam MUSCULOSKELETAL: Severe tenderness and pain in the right hip on palpation. Pain in the left hip with palpation GT area ttp Weakness with hip abduction.. Left shoulder exhibits pain with limited range of motion, tenderness in the [facility] joint, weakness in abduction, and positive empty can and Hawkins tests.  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 Pain is muscular, exacerbated by walking, with gluteus medius and minimus weakness contributing to instability. - Consider injection into GT area after  evaluating xrays. - Ordered x-rays of both hips to assess arthritis extent. - Recommended neck x-ray to evaluate cervical radiculopathy.  Left shoulder pain with AC joint arthritis Left shoulder pain with AC joint arthritis, possibly related to cervical radiculopathy. - Ordered neck x-ray to assess for cervical radiculopathy.  Cervical radiculopathy Suspected cervical radiculopathy with pain radiating from neck to arm and numbness. Differential includes shoulder versus cervical origin. - Ordered neck x-ray to evaluate for cervical radiculopathy.  Essential hypertension Blood pressure elevated, potentially due to pain and stress. Management to be reassessed post pain management. - Monitor blood pressure and reassess management after pain is addressed.  Depression Current sertraline 100 mg not effective. Previous positive response to Lexapro noted. Plan to address pain before medication adjustments. - Continue sertraline regimen. - Reassess mood and medication efficacy after pain management.     Bow Buntyn A. Vita MD [facility]

## 2024-01-06 ENCOUNTER — Other Ambulatory Visit (INDEPENDENT_AMBULATORY_CARE_PROVIDER_SITE_OTHER): Payer: Self-pay

## 2024-01-06 ENCOUNTER — Ambulatory Visit
Admission: RE | Admit: 2024-01-06 | Discharge: 2024-01-06 | Disposition: A | Source: Ambulatory Visit | Attending: Family Medicine

## 2024-01-06 ENCOUNTER — Encounter: Payer: Self-pay | Admitting: Family Medicine

## 2024-01-06 ENCOUNTER — Ambulatory Visit: Payer: Self-pay | Admitting: Family Medicine

## 2024-01-06 VITALS — BP 132/88 | HR 72 | Wt 210.6 lb

## 2024-01-06 DIAGNOSIS — M25559 Pain in unspecified hip: Secondary | ICD-10-CM | POA: Diagnosis not present

## 2024-01-06 DIAGNOSIS — G8929 Other chronic pain: Secondary | ICD-10-CM

## 2024-01-06 DIAGNOSIS — M25552 Pain in left hip: Secondary | ICD-10-CM

## 2024-01-06 DIAGNOSIS — M25551 Pain in right hip: Secondary | ICD-10-CM

## 2024-01-06 DIAGNOSIS — M5412 Radiculopathy, cervical region: Secondary | ICD-10-CM | POA: Diagnosis not present

## 2024-01-06 NOTE — Progress Notes (Signed)
 Name: [name]   Date of Visit: 01/06/24   Date of last visit with me: 01/02/2024   CHIEF COMPLAINT:  Chief Complaint  Patient presents with   Follow-up    1 week follow up. Knot in middle finger.        HPI:  Discussed the use of AI scribe software for clinical note transcription with the patient, who gave verbal consent to proceed.  History of Present Illness   Shelby [name] is a 53 year old female who presents with left hip and neck pain. She is accompanied by her daughter, Shelby [name].  She experiences significant soreness and pain in her left hip, particularly tender when pressure is applied to the buttock area. Previous x-rays showed some extra bone formation in the left hip.  She reports pain radiating down her left arm into her hand, associated with numbness and tingling. She experiences muscle spasms in her throat area, described as feeling like 'tightening and bulging'.  Additionally, she mentions a knot in her finger joint that is painful when pressure is applied. She mentions a knot in her finger joint that is painful when pressure is applied and recalls a similar issue in the past that resolved on its own. She recalls a similar issue in the past that resolved on its own.  She experiences muscle cramps, particularly when she attempts to stretch or move suddenly. She acknowledges inadequate water intake, having only consumed water with her morning medication, levothyroxine.         OBJECTIVE:       01/02/2024    4:01 PM  Depression screen PHQ 2/9  Decreased Interest 3  Down, Depressed, Hopeless 3  PHQ - 2 Score 6  Altered sleeping 3  Tired, decreased energy 3  Change in appetite 3  Feeling bad or failure about yourself  3  Trouble concentrating 0  Moving slowly or fidgety/restless 1  Suicidal thoughts 0  PHQ-9 Score 19     BP Readings from Last 3 Encounters:  01/06/24 132/88  01/02/24 (!) 134/92  07/20/23 (!) 148/94     BP 132/88   Pulse 72   Wt 210 lb 9.6 oz (95.5 kg)   SpO2 99%   BMI 37.31 kg/m    Physical Exam   MUSCULOSKELETAL: Tenderness in left hip region on palpation. Fibrose area in finger joint, early trigger finger. Degenerative changes in cervical spine, C4 to C6.      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 - Administered steroid injection to the left hip. - Provided hip and shoulder exercises. - Will evaluate response to injection and exercises. - Patient advised to take easy for 2 days followed by increasing activity and doing strengthening exercises.   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 - Considering MRI of the neck. - Will consider nerve block injection if MRI confirms compression. - Discussed potential use of oral steroids if symptoms persist.  Trigger finger, unspecified finger Fibrotic nodule indicative of early trigger finger, painful on pressure. - Administered steroid injection to reduce inflammation.       US -Guided Greater Trochanteric Bursa Injection, left After discussion on risks/benefits/indications and informed verbal consent was  obtained, a timeout was performed. The patient was lying in lateral recumbent position on exam table. Using ultrasound guidance, the greater trochanter was identified. The area overlying the trochanteric bursa was then prepped with Betadine and alcohol swabs. Following sterile precautions, ultrasound was reapplied to visualize needle guidance with a 22-gauge 3.5 needle utilizing an in-plane approach to inject the bursa with 2:2:1 lidocaine :bupivicaine:betamethasone. Delivery of the injectate was visualized  into the region of hypoechoic fluid of the greater trochanteric bursa. Patient tolerated procedure well without immediate complications.     Destan Franchini A. Vita MD [facility]

## 2024-01-07 ENCOUNTER — Telehealth: Payer: Self-pay

## 2024-01-07 NOTE — Telephone Encounter (Signed)
 Copied from CRM [id number]. Topic: General - Other >> Jan 07, 2024 11:18 AM Jasmin G wrote: Reason for CRM: Pt requested a call back from Ms. Melissa at [phone] regarding recent forms. Pt requested to be called after 3:45 p.m due to work and to leave a voicemail if she can't be reached letting her know when will forms be submitted.

## 2024-01-08 NOTE — Telephone Encounter (Signed)
 Faxed form for pt to fax number she provided

## 2024-01-08 NOTE — Telephone Encounter (Signed)
 Left voicemail for patient stating FMLA forms are completed and will be left in the front office for her to pick up

## 2024-01-12 ENCOUNTER — Telehealth: Payer: Self-pay | Admitting: Family Medicine

## 2024-01-12 NOTE — Telephone Encounter (Signed)
 Patient has what she needs

## 2024-01-12 NOTE — Telephone Encounter (Signed)
 Copied from CRM #[id number]. Topic: General - Call Back - No Documentation >> Jan 12, 2024  8:29 AM Rea BROCKS wrote: Reason for CRM: Patient is calling in and asking to speak with someone at the office because they messed up her FMLA paperwork. Called [facility]- advised to send in crm for provider to get back to her. Patient stated she is supposed to be clocked into work and is having documentation. Patient stated that she is just going to come up to the office to discuss the paperwork issues.    [phone] (M)  Pt stopped by office concerning FMLA forms  Patient is wanting intermittent leave ( not continuous) up to 8 hours from time period 01/02/24-02/25/2024 Pt unable to sit for long periods of time.  Discussed with Dr Vita and he is in agreement and signed new FMLA forms, gave copy to pt and also faxed copy to employer per pt request  Fax # [phone]

## 2024-01-13 ENCOUNTER — Ambulatory Visit: Admitting: Family Medicine

## 2024-01-14 ENCOUNTER — Telehealth: Payer: Self-pay | Admitting: Family Medicine

## 2024-01-14 NOTE — Telephone Encounter (Signed)
 Copied from CRM [id number]. Topic: General - Other >> Jan 14, 2024 12:29 PM Kevelyn M wrote: Reason for CRM: Patient calling for [facility] in regards FMLA paperwork. Attempted to transfer office is at lunch.   Call back [phone]  Spoke to Dr Vita and Manuelita to put 20 days and I faxed form back

## 2024-01-16 ENCOUNTER — Telehealth: Payer: Self-pay | Admitting: Family Medicine

## 2024-01-16 NOTE — Telephone Encounter (Signed)
 Copied from CRM [id number]. Topic: General - Call Back - No Documentation >> Jan 14, 2024  2:27 PM Ashley R wrote: Reason for CRM: requesting callback from [facility] Re: FMLA  Spoke to pt and Dr Vita updated paperwork and refaxed in Left voicemail for patient advising this

## 2024-01-27 ENCOUNTER — Ambulatory Visit

## 2024-02-03 ENCOUNTER — Ambulatory Visit: Admitting: Family Medicine

## 2024-02-03 ENCOUNTER — Encounter: Payer: Self-pay | Admitting: Family Medicine

## 2024-02-03 VITALS — BP 122/82 | HR 90 | Wt 213.8 lb

## 2024-02-03 DIAGNOSIS — F339 Major depressive disorder, recurrent, unspecified: Secondary | ICD-10-CM | POA: Insufficient documentation

## 2024-02-03 DIAGNOSIS — J3089 Other allergic rhinitis: Secondary | ICD-10-CM | POA: Diagnosis not present

## 2024-02-03 DIAGNOSIS — M25559 Pain in unspecified hip: Secondary | ICD-10-CM | POA: Diagnosis not present

## 2024-02-03 LAB — POCT GLYCOSYLATED HEMOGLOBIN (HGB A1C): Hemoglobin A1C: 6 % — AB (ref 4.0–5.6)

## 2024-02-03 MED ORDER — FLUTICASONE PROPIONATE 50 MCG/ACT NA SUSP: 2.0000 | Freq: Every day | NASAL | 6 refills | Status: AC

## 2024-02-03 MED ORDER — LORATADINE 10 MG PO TABS: 10.0000 mg | ORAL_TABLET | Freq: Every day | ORAL | 11 refills | Status: AC

## 2024-02-03 MED ORDER — SERTRALINE HCL 150 MG PO CAPS: 150.0000 mg | ORAL_CAPSULE | Freq: Every day | ORAL | 1 refills | Status: DC

## 2024-02-04 ENCOUNTER — Other Ambulatory Visit (HOSPITAL_COMMUNITY): Payer: Self-pay

## 2024-02-09 ENCOUNTER — Other Ambulatory Visit (HOSPITAL_COMMUNITY): Payer: Self-pay

## 2024-02-10 ENCOUNTER — Other Ambulatory Visit (HOSPITAL_COMMUNITY): Payer: Self-pay

## 2024-02-10 ENCOUNTER — Telehealth: Payer: Self-pay | Admitting: Pharmacy Technician

## 2024-02-10 DIAGNOSIS — F419 Anxiety disorder, unspecified: Secondary | ICD-10-CM

## 2024-02-10 DIAGNOSIS — F32A Depression, unspecified: Secondary | ICD-10-CM

## 2024-02-10 NOTE — Telephone Encounter (Signed)
 Pharmacy Patient Advocate Encounter   Received notification from Onbase that prior authorization for Sertraline  150mg  capsules is required/requested.   Insurance verification completed.   The patient is insured through [id number].   Per test claim:  Sertraline  tablets is preferred by the insurance.  If suggested medication is appropriate, Please send in a new RX and discontinue this one. If not, please advise as to why it's not appropriate so that we may request a Prior Authorization. Please note, some preferred medications may still require a PA.  If the suggested medications have not been trialed and there are no contraindications to their use, the PA will not be submitted, as it will not be approved.  Tablets are available as 25mg , 50mg , and 100mg . Ran test claim for 3 of the 50mg  tablets and insurance does cover with $0.00 copay. Can the medication be changed to allow for coverage?  CMM KEY# [id number]

## 2024-02-11 ENCOUNTER — Telehealth: Payer: Self-pay | Admitting: Family Medicine

## 2024-02-11 NOTE — Telephone Encounter (Signed)
 Copied from CRM #[id number]. Topic: General - Other >> Feb 11, 2024  2:10 PM Hadassah PARAS wrote: Reason for CRM: Pt is calling to get an update on the [facility] paperwork requesting for extension. Spoke w Eleanor and advised we are still awaiting for [facility] approval. Relayed info to pt

## 2024-02-13 ENCOUNTER — Other Ambulatory Visit (HOSPITAL_COMMUNITY): Payer: Self-pay

## 2024-02-13 NOTE — Telephone Encounter (Signed)
 Faxed new FMLA forms to [phone] and emailed copy to pt email address  Left voicemail for pt that forms were sent

## 2024-02-23 MED ORDER — METHYLPREDNISOLONE ACETATE 40 MG/ML IJ SUSP
40.0000 mg | Freq: Once | INTRAMUSCULAR | Status: AC
  Administered 2024-02-23: 40 mg via INTRAMUSCULAR

## 2024-02-23 MED ORDER — SERTRALINE HCL 100 MG PO TABS: 100.0000 mg | ORAL_TABLET | Freq: Every day | ORAL | 1 refills | Status: DC

## 2024-02-23 MED ORDER — SERTRALINE HCL 50 MG PO TABS: 50.0000 mg | ORAL_TABLET | Freq: Every day | ORAL | 1 refills | Status: AC

## 2024-02-23 MED ORDER — BUPIVACAINE HCL 0.25 % IJ SOLN
2.0000 mL | Freq: Once | INTRAMUSCULAR | Status: AC
  Administered 2024-02-23: 2 mL

## 2024-02-23 MED ORDER — LIDOCAINE HCL 1 % IJ SOLN
5.0000 mL | Freq: Once | INTRAMUSCULAR | Status: AC
  Administered 2024-02-23: 5 mL via INTRADERMAL

## 2024-02-23 NOTE — Telephone Encounter (Signed)
 Received another prior authorization request for Sertraline  150mg  capsules form her pharmacy. Can we double check to see if the new order was sent for the tablets?

## 2024-02-23 NOTE — Addendum Note (Signed)
 Addended by: LATTIE CARLO BROCKS on: 02/23/2024 12:15 PM   Modules accepted: Orders

## 2024-02-23 NOTE — Addendum Note (Signed)
 Addended by: Maykel Reitter on: 02/23/2024 10:03 AM   Modules accepted: Orders, Level of Service

## 2024-02-25 NOTE — Telephone Encounter (Signed)
 FMLA forms refaxed [phone]

## 2024-03-02 ENCOUNTER — Ambulatory Visit: Payer: Self-pay

## 2024-03-02 NOTE — Telephone Encounter (Signed)
 FYI Only or Action Required?: FYI only for provider: appointment scheduled on 1.7.26.  Patient was last seen in primary care on 02/03/2024 by Vita Morrow, MD.  Called Nurse Triage reporting Cough.  Symptoms began several weeks ago.  Interventions attempted: Rest, hydration, or home remedies.  Symptoms are: gradually worsening.  Triage Disposition: See HCP Within 4 Hours (Or PCP Triage)  Patient/caregiver understands and will follow disposition?: Yes     Reason for Triage: The patient shares that they have experienced a cough for roughly 1 and a half weeks. The patient would like to be advised as to potential cough medications that will help with their discomfort but not interfere with their current prescriptions. Please contact further when possible  Reason for Disposition  Wheezing is present  Answer Assessment - Initial Assessment Questions Cough x1.5 weeks. She is afraid to take any cough medications because her endocrinologist told her it could interact with her thyroid  medications. She states cough is so deep it hurts her whole body sometimes. Occasionally is coughing up what looks like green boogers or it's clear. Sometimes short of breath after coughing, like harder to get a good breath. When asked about wheezing she states yes but she noticed that before these symptoms. She has been using vicks vapor rub and that seems to help. Pt denies any higher acuity symptoms. Due to wheezing not new symptom, RN scheduled appt for tomorrow. RN did give care advise and instructions on when to go to UC or ER. Pt states understanding.     1. ONSET: When did the cough begin?      1.5 weeks ago 2. SEVERITY: How bad is the cough today?      mod 3. SPUTUM: Describe the color of your sputum (e.g., none, dry cough; clear, white, yellow, green)     Green or clear 4. HEMOPTYSIS: Are you coughing up any blood? If Yes, ask: How much? (e.g., flecks, streaks, tablespoons, etc.)     denies 5.  DIFFICULTY BREATHING: Are you having difficulty breathing? If Yes, ask: How bad is it? (e.g., mild, moderate, severe)      Mild after coughing 6. FEVER: Do you have a fever? If Yes, ask: What is your temperature, how was it measured, and when did it start?     denies 7. CARDIAC HISTORY: Do you have any history of heart disease? (e.g., heart attack, congestive heart failure)      no 8. LUNG HISTORY: Do you have any history of lung disease?  (e.g., pulmonary embolus, asthma, emphysema)    no  10. OTHER SYMPTOMS: Do you have any other symptoms? (e.g., runny nose, wheezing, chest pain)       Wheezing at times  Protocols used: Cough - Acute Productive-A-AH

## 2024-03-03 ENCOUNTER — Ambulatory Visit: Payer: Self-pay

## 2024-03-03 ENCOUNTER — Ambulatory Visit: Admitting: Family Medicine

## 2024-03-03 NOTE — Telephone Encounter (Signed)
 Patient states that she just wanted to  Patient was instructed that she has to have the copayment The card is in the patient's son's wallet at work so she cannot make the copayment. No change in symptoms. Patient is adamant about not coming in for this appointment today due to the the copay and card situation. Please see triage notes from yesterday in relation to this phone call She is advised to call us  back if anything changes and if anything worsens to seek immediate care at Urgent Care or the Emergency Room. Patient verbalized understanding.      FYI Only or Action Required?: Action required by provider: clinical question for provider, update on patient condition, and patient cancelling her appointment.  Patient was last seen in primary care on 02/03/2024 by Vita Morrow, MD.  Called Nurse Triage reporting Appointment.  Symptoms are: unchanged.  Triage Disposition: Call PCP When Office is Open  Patient/caregiver understands and will follow disposition?: No, wishes to speak with PCP                  Copied from CRM #[id number]. Topic: Clinical - Red Word Triage >> Mar 03, 2024 10:53 AM Richerd A wrote: [facility] that prompted transfer to Nurse Triage: Patient is calling to cancel appointment for today Reason for Disposition  [1] Caller requesting NON-URGENT health information AND [2] PCP's office is the best resource  Answer Assessment - Initial Assessment Questions Patient states that she just wanted to  Patient was instructed that she has to have the copayment The card is in the patient's son's wallet at work so she cannot make the copayment. No change in symptoms. Patient is adamant about not coming in for this appointment today due to the the copay and card situation. Please see triage notes from yesterday in relation to this phone call She is advised to call us  back if anything changes and if anything worsens to seek immediate care at Urgent Care or the Emergency  Room. Patient verbalized understanding.  Protocols used: Information Only Call - No Triage-A-AH

## 2024-03-03 NOTE — Telephone Encounter (Signed)
 Called [facility] and advised them of patient's wanting to cancel her appointment.

## 2024-03-11 ENCOUNTER — Ambulatory Visit (INDEPENDENT_AMBULATORY_CARE_PROVIDER_SITE_OTHER): Admitting: Family Medicine

## 2024-03-11 ENCOUNTER — Encounter: Payer: Self-pay | Admitting: Family Medicine

## 2024-03-11 VITALS — BP 138/80 | HR 76 | Temp 99.0°F | Ht 64.0 in | Wt 211.4 lb

## 2024-03-11 DIAGNOSIS — R051 Acute cough: Secondary | ICD-10-CM

## 2024-03-11 DIAGNOSIS — J4531 Mild persistent asthma with (acute) exacerbation: Secondary | ICD-10-CM

## 2024-03-11 LAB — POC COVID19/FLU A&B COMBO
Covid Antigen, POC: NEGATIVE
Influenza A Antigen, POC: NEGATIVE
Influenza B Antigen, POC: NEGATIVE

## 2024-03-11 MED ORDER — PREDNISONE 20 MG PO TABS: 40.0000 mg | ORAL_TABLET | Freq: Every day | ORAL | 0 refills | Status: AC

## 2024-03-11 NOTE — Progress Notes (Signed)
" ° °  Name: Shelby [name]   Date of Visit: 03/11/24   Date of last visit with me: 02/11/2024   CHIEF COMPLAINT:  Chief Complaint  Patient presents with   other    Started with diarrhea, cough lasted 2 weeks was feeling better then got worse yesterday coughing again.history of pneumonia, has deep cough and very sore in stomach area, hears wheezing crackling sound when breathing.       HPI:  Discussed the use of AI scribe software for clinical note transcription with the patient, who gave verbal consent to proceed.  History of Present Illness   Shelby [name] is a 54 year old female who presents with a persistent cough and wheezing. She is accompanied by her son.  She has been experiencing a persistent cough and wheezing for about a month, initially starting with an episode of cough and diarrhea. The symptoms improved temporarily with the use of Vicks VapoRub but have returned as of yesterday. The cough occurs both during the day and at night. She reports hearing herself wheezing when lying down to sleep.  She is concerned about the possibility of pneumonia, given her history of pneumonia last year.  She has a history of allergies and has not been taking her prescribed allergy medications, including Claritin  and Fluticasone  (Flonase ), due to a preference to avoid feeling 'drugged up.' Although she has filled the prescription, she has not been using it regularly.  She also mentions having had diarrhea previously, which she attributes to not having a gallbladder. She describes the diarrhea as 'straight water' and different from her usual symptoms.         OBJECTIVE:       01/02/2024    4:01 PM  Depression screen PHQ 2/9  Decreased Interest 3  Down, Depressed, Hopeless 3  PHQ - 2 Score 6  Altered sleeping 3  Tired, decreased energy 3  Change in appetite 3  Feeling bad or failure about yourself  3  Trouble concentrating 0  Moving slowly or  fidgety/restless 1  Suicidal thoughts 0  PHQ-9 Score 19     BP Readings from Last 3 Encounters:  03/11/24 138/80  02/03/24 122/82  01/06/24 132/88    BP 138/80   Pulse 76   Temp 99 F (37.2 C) (Tympanic)   Ht 5' 4 (1.626 m)   Wt 211 lb 6.4 oz (95.9 kg)   LMP 02/23/2024 (Approximate)   SpO2 98%   BMI 36.29 kg/m    Physical Exam   CHEST: Slight wheeze on auscultation.      Physical Exam Constitutional:      Appearance: Normal appearance.  Neurological:     General: No focal deficit present.     Mental Status: She is alert and oriented to person, place, and time. Mental status is at baseline.     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 No pneumonia. Steroids planned for exacerbation. Claritin  and Flonase  recommended for allergy management. - Start 7-day course of steroids. - Use Claritin  and Flonase  daily. - Contact office if no improvement in a week for potential antibiotics.         Pharrah Rottman A. Vita MD [facility] "

## 2024-03-16 ENCOUNTER — Other Ambulatory Visit: Payer: Self-pay | Admitting: Family Medicine

## 2024-03-16 ENCOUNTER — Telehealth: Payer: Self-pay

## 2024-03-16 ENCOUNTER — Ambulatory Visit: Payer: Self-pay

## 2024-03-16 DIAGNOSIS — J069 Acute upper respiratory infection, unspecified: Secondary | ICD-10-CM

## 2024-03-16 DIAGNOSIS — R051 Acute cough: Secondary | ICD-10-CM

## 2024-03-16 DIAGNOSIS — B379 Candidiasis, unspecified: Secondary | ICD-10-CM

## 2024-03-16 MED ORDER — NYSTATIN 100000 UNIT/ML MT SUSP: 5.0000 mL | Freq: Four times a day (QID) | OROMUCOSAL | 0 refills | Status: AC

## 2024-03-16 MED ORDER — AMOXICILLIN-POT CLAVULANATE 875-125 MG PO TABS: 1.0000 | ORAL_TABLET | Freq: Two times a day (BID) | ORAL | 0 refills | Status: AC

## 2024-03-16 NOTE — Telephone Encounter (Signed)
 Spoke with patient, wants to know if she can be prescribe something for a yeast infection states a pill does not work for her.

## 2024-03-16 NOTE — Telephone Encounter (Signed)
 FYI Only or Action Required?: Action required by provider: clinical question for provider and update on patient condition.  Patient was last seen in primary care on 03/11/2024 by Vita Morrow, MD.  Called Nurse Triage reporting Nasal Congestion.  Symptoms began several days ago.  Interventions attempted: Prescription medications: flonase ; claritin ; pred.  Symptoms are: gradually worsening.  Triage Disposition: See Physician Within 24 Hours  Patient/caregiver understands and will follow disposition?: No, wishes to speak with PCP    Message from Bessemer S sent at 03/16/2024 12:23 PM EST  Reason for Triage: fluticasone  (FLONASE ) 50 MCG/ACT nasal spray, loratadine  (CLARITIN ) 10 MG tablet, and predniSONE  (DELTASONE ) 20 MG tablet are not working.   Can't breathe, deep cough     Reason for Disposition  [1] Continuous (nonstop) coughing interferes with work or school AND [2] no improvement using cough treatment per Care Advice  Answer Assessment - Initial Assessment Questions PT called in to f/u with provider. Pt was seen 03/11/24 by PCP and told to f/u with clinic if symptoms continued/worsened. Pt states no abx at the time of appt d/t her having diarrhea but that has since passed and no further GI symptoms. Pt reports her cough have evolved into worsening green mucous congestion that causes her to go into coughing spells. Pt states she is trying to work but has to put pt's on hold or call them back d/t worsening cough. Pt is using flonase  and pred taper daily, uses Claritin  sparingly d/t brain fog. Pt requesting abx and topical yeast medication for prophylactic use with abx. Pt worried about medications interacting with her synthroid. Discussed provider can review medication interactions; confirmed no medication allergies other than latex. Pt prefers topical yeast medication, stating PO medication has never worked in the past. Pt would prefer f/u message via my chart as she is currently working.  Please advise.       1. ONSET: When did the cough begin?      Ongoing since appt 03/11/24  2. SEVERITY: How bad is the cough today?      Mod to severe  3. SPUTUM: Describe the color of your sputum (e.g., none, dry cough; clear, white, yellow, green)     Green   4. HEMOPTYSIS: Are you coughing up any blood? If Yes, ask: How much? (e.g., flecks, streaks, tablespoons, etc.)     No   5. DIFFICULTY BREATHING: Are you having difficulty breathing? If Yes, ask: How bad is it? (e.g., mild, moderate, severe)      Yes d/t congestion, pt is mouth breathing   6. FEVER: Do you have a fever? If Yes, ask: What is your temperature, how was it measured, and when did it start?     No   7. CARDIAC HISTORY: Do you have any history of heart disease? (e.g., heart attack, congestive heart failure)      No   8. LUNG HISTORY: Do you have any history of lung disease?  (e.g., pulmonary embolus, asthma, emphysema)     No   9. PE RISK FACTORS: Do you have a history of blood clots? (or: recent major surgery, recent prolonged travel, bedridden)     No   10. OTHER SYMPTOMS: Do you have any other symptoms? (e.g., runny nose, wheezing, chest pain)       Runny nose  Protocols used: Cough - Acute Productive-A-AH

## 2024-03-16 NOTE — Telephone Encounter (Signed)
 Copied from CRM #[id number]. Topic: Clinical - Medication Question >> Mar 16, 2024  3:53 PM Alfonso HERO wrote: Reason for CRM: patient calling to see if she needs to stop taking the fluticasone  (FLONASE ) 50 MCG/ACT nasal spray, loratadine  (CLARITIN ) 10 MG tablet, and predniSONE  (DELTASONE ) 20 MG tablet since she is taking the amoxicillin -clavulanate (AUGMENTIN ) 875-125 MG tablet or continue. Asking for a call back.

## 2024-03-16 NOTE — Addendum Note (Signed)
 Addended by: Dagmar Adcox on: 03/16/2024 04:18 PM   Modules accepted: Orders
